# Patient Record
Sex: Male | Born: 1955
Health system: Southern US, Community
[De-identification: ages and names within clinical notes are randomized; demographics above are authoritative.]

## PROBLEM LIST (undated history)

## (undated) DIAGNOSIS — J189 Pneumonia, unspecified organism: Secondary | ICD-10-CM

## (undated) DIAGNOSIS — E119 Type 2 diabetes mellitus without complications: Secondary | ICD-10-CM

## (undated) DIAGNOSIS — K219 Gastro-esophageal reflux disease without esophagitis: Secondary | ICD-10-CM

## (undated) DIAGNOSIS — M1990 Unspecified osteoarthritis, unspecified site: Secondary | ICD-10-CM

## (undated) DIAGNOSIS — I2510 Atherosclerotic heart disease of native coronary artery without angina pectoris: Secondary | ICD-10-CM

## (undated) DIAGNOSIS — I10 Essential (primary) hypertension: Secondary | ICD-10-CM

## (undated) DIAGNOSIS — G4730 Sleep apnea, unspecified: Secondary | ICD-10-CM

## (undated) DIAGNOSIS — E785 Hyperlipidemia, unspecified: Secondary | ICD-10-CM

## (undated) HISTORY — PX: CARDIAC CATHETERIZATION: SHX172

## (undated) HISTORY — PX: TONSILLECTOMY: SUR1361

---

## 2016-03-26 HISTORY — PX: KNEE ARTHROPLASTY: SHX992

## 2018-03-03 ENCOUNTER — Emergency Department (HOSPITAL_COMMUNITY)
Admission: EM | Admit: 2018-03-03 | Discharge: 2018-03-03 | Disposition: A | Discharge status: Home / Self Care | Payer: PRIVATE HEALTH INSURANCE | Attending: Emergency Medicine | Admitting: Emergency Medicine

## 2018-03-03 ENCOUNTER — Encounter (HOSPITAL_COMMUNITY): Payer: Self-pay | Admitting: Emergency Medicine

## 2018-03-03 ENCOUNTER — Emergency Department (HOSPITAL_COMMUNITY): Payer: PRIVATE HEALTH INSURANCE

## 2018-03-03 DIAGNOSIS — R079 Chest pain, unspecified: Secondary | ICD-10-CM

## 2018-03-03 DIAGNOSIS — Z79899 Other long term (current) drug therapy: Secondary | ICD-10-CM | POA: Diagnosis not present

## 2018-03-03 DIAGNOSIS — Z7982 Long term (current) use of aspirin: Secondary | ICD-10-CM | POA: Diagnosis not present

## 2018-03-03 DIAGNOSIS — R0789 Other chest pain: Secondary | ICD-10-CM | POA: Diagnosis present

## 2018-03-03 DIAGNOSIS — I10 Essential (primary) hypertension: Secondary | ICD-10-CM | POA: Insufficient documentation

## 2018-03-03 DIAGNOSIS — Z794 Long term (current) use of insulin: Secondary | ICD-10-CM | POA: Insufficient documentation

## 2018-03-03 DIAGNOSIS — E119 Type 2 diabetes mellitus without complications: Secondary | ICD-10-CM | POA: Insufficient documentation

## 2018-03-03 HISTORY — DX: Type 2 diabetes mellitus without complications: E11.9

## 2018-03-03 HISTORY — DX: Essential (primary) hypertension: I10

## 2018-03-03 LAB — CBC WITH DIFFERENTIAL/PLATELET
Abs Immature Granulocytes: 0.08 10*3/uL — ABNORMAL HIGH (ref 0.00–0.07)
Basophils Absolute: 0.1 10*3/uL (ref 0.0–0.1)
Basophils Relative: 0 %
Eosinophils Absolute: 0.2 10*3/uL (ref 0.0–0.5)
Eosinophils Relative: 2 %
HCT: 50.9 % (ref 39.0–52.0)
Hemoglobin: 15.9 g/dL (ref 13.0–17.0)
IMMATURE GRANULOCYTES: 1 %
Lymphocytes Relative: 31 %
Lymphs Abs: 4 10*3/uL (ref 0.7–4.0)
MCH: 28.8 pg (ref 26.0–34.0)
MCHC: 31.2 g/dL (ref 30.0–36.0)
MCV: 92.2 fL (ref 80.0–100.0)
Monocytes Absolute: 1.1 10*3/uL — ABNORMAL HIGH (ref 0.1–1.0)
Monocytes Relative: 8 %
NEUTROS ABS: 7.7 10*3/uL (ref 1.7–7.7)
NEUTROS PCT: 58 %
Platelets: 280 10*3/uL (ref 150–400)
RBC: 5.52 MIL/uL (ref 4.22–5.81)
RDW: 15.2 % (ref 11.5–15.5)
WBC: 13.1 10*3/uL — ABNORMAL HIGH (ref 4.0–10.5)
nRBC: 0 % (ref 0.0–0.2)

## 2018-03-03 LAB — BASIC METABOLIC PANEL
ANION GAP: 13 (ref 5–15)
BUN: 17 mg/dL (ref 8–23)
CO2: 24 mmol/L (ref 22–32)
Calcium: 9.5 mg/dL (ref 8.9–10.3)
Chloride: 101 mmol/L (ref 98–111)
Creatinine, Ser: 1.26 mg/dL — ABNORMAL HIGH (ref 0.61–1.24)
GFR calc Af Amer: >60 mL/min (ref >60)
GFR calc non Af Amer: >60 mL/min (ref >60)
Glucose, Bld: 222 mg/dL — ABNORMAL HIGH (ref 70–99)
Potassium: 4.6 mmol/L (ref 3.5–5.1)
Sodium: 138 mmol/L (ref 135–145)

## 2018-03-03 LAB — I-STAT TROPONIN, ED: Troponin i, poc: 0 ng/mL (ref 0.00–0.08)

## 2018-03-03 NOTE — ED Provider Notes (Signed)
[facility] EMERGENCY DEPARTMENT Provider Note   CSN: [id number] Arrival date & time: 03/03/18  1741     History   Chief Complaint Chief Complaint  Patient presents with  . Chest Pain    HPI  Cory Sosa is a 62 y.o. male with past medical history of type 2 diabetes, hypertension, presenting to the emergency department with complaint of intermittent chest discomfort for a couple of weeks.  He states his symptoms come and go, when he has the chest discomfort it feels more of an annoyance.  Sometimes it becomes more intense, described as a tightness or pressure.  The pain is nonexertional, lasts 5 to 15 minutes in duration.  Pain is not associated with shortness of breath, diaphoresis, anxiety.  Pain does not radiate.  Denies cardiac history.  States he had a normal stress test 4 to 5 years ago.  Went to the urgent care today who recommended he report to the ED.  He states the urgent care gave him nitro, however when they gave it to him his pain had already subsided. He does state that his diabetes has been somewhat out of control recently after getting steroid injections for his back, though states it is normally well controlled.  The history is provided by the patient.    Past Medical History:  Diagnosis Date  . Diabetes mellitus without complication (HCC)   . Hypertension    controlled with medications    There are no active problems to display for this patient.   History reviewed. No pertinent surgical history.      Home Medications    Prior to Admission medications   Medication Sig Start Date End Date Taking? Authorizing Provider  aspirin EC 81 MG tablet Take 81 mg by mouth daily.   Yes [provider]  Cyanocobalamin (VITAMIN B-12 PO) Take 2 tablets by mouth at bedtime.   Yes [provider]  ibuprofen (ADVIL,MOTRIN) 200 MG tablet Take 800 mg by mouth every 6 (six) hours as needed (pain).   Yes [provider]  Insulin  Isophane & Regular Human (NOVOLIN 70/30 FLEXPEN) (70-30) 100 UNIT/ML PEN Inject 55 Units into the skin 2 (two) times daily.   Yes [provider]  losartan (COZAAR) 25 MG tablet Take 25 mg by mouth daily.   Yes [provider]  metFORMIN (GLUCOPHAGE-XR) 500 MG 24 hr tablet Take 1,000 mg by mouth 2 (two) times daily with a meal. 01/27/18  Yes [provider]  Naphazoline-Pheniramine (ALLERGY EYE OP) Place 1 drop into both eyes daily as needed (itching/allergies).   Yes [provider]  pantoprazole (PROTONIX) 40 MG tablet Take 40 mg by mouth daily.   Yes [provider]  PRESCRIPTION MEDICATION See admin instructions. Monthly steroid injection administered by Dr. Laverta Baltimore, W/S   Yes [provider]  PRESCRIPTION MEDICATION See admin instructions. Monthly allergy shot administered by [facility],    Yes [provider]  rosuvastatin (CRESTOR) 20 MG tablet Take 20 mg by mouth at bedtime.   Yes [provider]  testosterone cypionate (DEPOTESTOTERONE CYPIONATE) 100 MG/ML injection Inject 100 mg into the muscle every 14 (fourteen) days. 01/09/18  Yes [provider]  Vitamin D, Ergocalciferol, (DRISDOL) 1.25 MG (50000 UT) CAPS capsule Take 50,000 Units by mouth every Monday.   Yes [provider]  zolpidem (AMBIEN) 10 MG tablet Take 5 mg by mouth at bedtime as needed for sleep.  01/08/18  Yes [provider]  Family History No family history on file.  Social History Social History   Tobacco Use  . Smoking status: Not on file  Substance Use Topics  . Alcohol use: Not on file  . Drug use: Not on file     Allergies   Contrast media [iodinated diagnostic agents] and Rocephin [ceftriaxone sodium in dextrose]   Review of Systems Review of Systems  Constitutional: Negative for diaphoresis.  Respiratory: Negative for shortness of breath.   Cardiovascular: Positive for  chest pain. Negative for palpitations.  Gastrointestinal: Negative for nausea.  All other systems reviewed and are negative.    Physical Exam Updated Vital Signs BP 123/80   Pulse 77   Temp 98.1 F (36.7 C) (Oral)   Resp (!) 21   Ht 5\' 10"  (1.778 m)   Wt 106.1 kg   SpO2 96%   BMI 33.58 kg/m   Physical Exam  Constitutional: He appears well-developed and well-nourished. No distress.  HENT:  Head: Normocephalic and atraumatic.  Eyes: Conjunctivae are normal.  Neck: Normal range of motion. Neck supple. No JVD present. No tracheal deviation present.  Cardiovascular: Normal rate, regular rhythm, normal heart sounds and intact distal pulses.  Pulmonary/Chest: Effort normal and breath sounds normal. No respiratory distress. He exhibits no tenderness.  Abdominal: Soft. Bowel sounds are normal. He exhibits no distension and no mass. There is no tenderness. There is no guarding.  Musculoskeletal: He exhibits no edema.  Neurological: He is alert.  Skin: Skin is warm. He is not diaphoretic.  Psychiatric: He has a normal mood and affect. His behavior is normal.  Nursing note and vitals reviewed.    ED Treatments / Results  Labs (all labs ordered are listed, but only abnormal results are displayed) Labs Reviewed  BASIC METABOLIC PANEL - Abnormal; Notable for the following components:      Result Value   Glucose, Bld 222 (*)    Creatinine, Ser 1.26 (*)    All other components within normal limits  CBC WITH DIFFERENTIAL/PLATELET - Abnormal; Notable for the following components:   WBC 13.1 (*)    Monocytes Absolute 1.1 (*)    Abs Immature Granulocytes 0.08 (*)    All other components within normal limits  I-STAT TROPONIN, ED    EKG None  Radiology Dg Chest 2 View  Result Date: 03/03/2018 CLINICAL DATA:  Chest pain for 2 weeks EXAM: CHEST - 2 VIEW COMPARISON:  None. FINDINGS: Low volume chest with mild streaky opacity at the bases. No air bronchogram, effusion, or  pneumothorax. Normal heart size and mediastinal contours. IMPRESSION: Low volume chest with mild atelectasis. Electronically Signed   By: Marnee SpringJonathon  Watts M.D.   On: 03/03/2018 19:35    Procedures Procedures (including critical care time)  Medications Ordered in ED Medications - No data to display   Initial Impression / Assessment and Plan / ED Course  I have reviewed the triage vital signs and the nursing notes.  Pertinent labs & imaging results that were available during my care of the patient were reviewed by me and considered in my medical decision making (see chart for details).     Pt presenting with intermittent nonexertional chest pains x2 weeks.  Chest pain is not likely of acute cardiac or pulmonary etiology d/t presentation, low risk Wells, VSS, no tracheal deviation, no JVD or new murmur, RRR, breath sounds equal bilaterally, EKG without acute abnormalities, negative troponin, and negative CXR.  Heart score 4.  Patient is to be discharged with recommendation  to follow up with PCP in regards to today's hospital visit. Pt has been advised to return to the ED if CP becomes exertional, associated with diaphoresis or nausea, radiates to left jaw/arm, worsens or becomes concerning in any way. Pt appears reliable for follow up and is agreeable to discharge.   Discussed results, findings, treatment and follow up. Patient advised of return precautions. Patient verbalized understanding and agreed with plan. Final Clinical Impressions(s) / ED Diagnoses   Final diagnoses:  Intermittent chest pain    ED Discharge Orders    None       Tacari Repass, [place] N, PA-C 03/03/18 2053    Sabas Sous, MD 03/04/18 [id number]

## 2018-03-03 NOTE — Discharge Instructions (Signed)
Please read instructions below. Return to the ER for new or worsening symptoms; including worsening chest pain, shortness of breath, pain that radiates to the arm or neck, pain or shortness of breath worsened with exertion. Follow up with your primary care provider regarding your visit today. ° °

## 2018-03-03 NOTE — ED Triage Notes (Signed)
Pt arrives via EMS from an Urgent Care with reports of intermittent central CP for 2 weeks. Pt took 2 nitro with some relief. Denies any radiation. Hx of diabetes.

## 2018-03-03 NOTE — ED Notes (Signed)
Discharge instructions reviewed with patient. All questions answered. Patient ambulated to vehicle with belongings 

## 2018-03-03 NOTE — ED Notes (Signed)
Pt to xray via stretcher

## 2019-03-27 DIAGNOSIS — I219 Acute myocardial infarction, unspecified: Secondary | ICD-10-CM

## 2019-03-27 HISTORY — DX: Acute myocardial infarction, unspecified: I21.9

## 2020-02-07 ENCOUNTER — Inpatient Hospital Stay (HOSPITAL_COMMUNITY)
Admission: EM | Disposition: A | Discharge status: Home / Self Care | Payer: Self-pay | Source: Home / Self Care | Attending: Cardiology

## 2020-02-07 ENCOUNTER — Inpatient Hospital Stay (HOSPITAL_COMMUNITY)
Admission: EM | Admit: 2020-02-07 | Discharge: 2020-02-08 | DRG: 229 | Disposition: A | Discharge status: Home / Self Care | Payer: Medicare HMO | Attending: Cardiology | Admitting: Cardiology

## 2020-02-07 ENCOUNTER — Emergency Department (HOSPITAL_COMMUNITY): Payer: Medicare HMO

## 2020-02-07 ENCOUNTER — Encounter (HOSPITAL_COMMUNITY): Payer: Self-pay | Admitting: Cardiology

## 2020-02-07 ENCOUNTER — Other Ambulatory Visit: Payer: Self-pay

## 2020-02-07 DIAGNOSIS — Z7982 Long term (current) use of aspirin: Secondary | ICD-10-CM

## 2020-02-07 DIAGNOSIS — Z79899 Other long term (current) drug therapy: Secondary | ICD-10-CM

## 2020-02-07 DIAGNOSIS — I213 ST elevation (STEMI) myocardial infarction of unspecified site: Principal | ICD-10-CM | POA: Diagnosis present

## 2020-02-07 DIAGNOSIS — Z91041 Radiographic dye allergy status: Secondary | ICD-10-CM | POA: Diagnosis not present

## 2020-02-07 DIAGNOSIS — I10 Essential (primary) hypertension: Secondary | ICD-10-CM | POA: Diagnosis present

## 2020-02-07 DIAGNOSIS — Z6834 Body mass index (BMI) 34.0-34.9, adult: Secondary | ICD-10-CM

## 2020-02-07 DIAGNOSIS — I2109 ST elevation (STEMI) myocardial infarction involving other coronary artery of anterior wall: Secondary | ICD-10-CM | POA: Diagnosis present

## 2020-02-07 DIAGNOSIS — E782 Mixed hyperlipidemia: Secondary | ICD-10-CM | POA: Diagnosis present

## 2020-02-07 DIAGNOSIS — Z20822 Contact with and (suspected) exposure to covid-19: Secondary | ICD-10-CM | POA: Diagnosis present

## 2020-02-07 DIAGNOSIS — E1165 Type 2 diabetes mellitus with hyperglycemia: Secondary | ICD-10-CM | POA: Diagnosis present

## 2020-02-07 DIAGNOSIS — Z955 Presence of coronary angioplasty implant and graft: Secondary | ICD-10-CM

## 2020-02-07 DIAGNOSIS — Z888 Allergy status to other drugs, medicaments and biological substances status: Secondary | ICD-10-CM

## 2020-02-07 DIAGNOSIS — Z794 Long term (current) use of insulin: Secondary | ICD-10-CM | POA: Diagnosis not present

## 2020-02-07 DIAGNOSIS — E669 Obesity, unspecified: Secondary | ICD-10-CM | POA: Diagnosis present

## 2020-02-07 HISTORY — PX: CORONARY/GRAFT ACUTE MI REVASCULARIZATION: CATH118305

## 2020-02-07 HISTORY — PX: CORONARY THROMBECTOMY: CATH118304

## 2020-02-07 HISTORY — PX: LEFT HEART CATH AND CORONARY ANGIOGRAPHY: CATH118249

## 2020-02-07 LAB — CBC WITH DIFFERENTIAL/PLATELET
Abs Immature Granulocytes: 0.02 10*3/uL (ref 0.00–0.07)
Basophils Absolute: 0.1 10*3/uL (ref 0.0–0.1)
Basophils Relative: 1 %
Eosinophils Absolute: 0.3 10*3/uL (ref 0.0–0.5)
Eosinophils Relative: 3 %
HCT: 45 % (ref 39.0–52.0)
Hemoglobin: 15 g/dL (ref 13.0–17.0)
Immature Granulocytes: 0 %
Lymphocytes Relative: 52 %
Lymphs Abs: 5.4 10*3/uL — ABNORMAL HIGH (ref 0.7–4.0)
MCH: 31 pg (ref 26.0–34.0)
MCHC: 33.3 g/dL (ref 30.0–36.0)
MCV: 93 fL (ref 80.0–100.0)
Monocytes Absolute: 0.9 10*3/uL (ref 0.1–1.0)
Monocytes Relative: 9 %
Neutro Abs: 3.6 10*3/uL (ref 1.7–7.7)
Neutrophils Relative %: 35 %
Platelets: 302 10*3/uL (ref 150–400)
RBC: 4.84 MIL/uL (ref 4.22–5.81)
RDW: 13.8 % (ref 11.5–15.5)
WBC: 10.3 10*3/uL (ref 4.0–10.5)
nRBC: 0 % (ref 0.0–0.2)

## 2020-02-07 LAB — COMPREHENSIVE METABOLIC PANEL
ALT: 72 U/L — ABNORMAL HIGH (ref 0–44)
AST: 45 U/L — ABNORMAL HIGH (ref 15–41)
Albumin: 3.9 g/dL (ref 3.5–5.0)
Alkaline Phosphatase: 57 U/L (ref 38–126)
Anion gap: 11 (ref 5–15)
BUN: 14 mg/dL (ref 8–23)
CO2: 24 mmol/L (ref 22–32)
Calcium: 9.5 mg/dL (ref 8.9–10.3)
Chloride: 105 mmol/L (ref 98–111)
Creatinine, Ser: 0.97 mg/dL (ref 0.61–1.24)
GFR, Estimated: >60 mL/min (ref >60)
Glucose, Bld: 178 mg/dL — ABNORMAL HIGH (ref 70–99)
Potassium: 3.5 mmol/L (ref 3.5–5.1)
Sodium: 140 mmol/L (ref 135–145)
Total Bilirubin: 0.5 mg/dL (ref 0.3–1.2)
Total Protein: 6.8 g/dL (ref 6.5–8.1)

## 2020-02-07 LAB — RESPIRATORY PANEL BY RT PCR (FLU A&B, COVID)
Influenza A by PCR: NEGATIVE
Influenza B by PCR: NEGATIVE
SARS Coronavirus 2 by RT PCR: NEGATIVE

## 2020-02-07 LAB — LIPID PANEL
Cholesterol: 116 mg/dL (ref 0–200)
HDL: 42 mg/dL (ref >40)
LDL Cholesterol: 33 mg/dL (ref 0–99)
Total CHOL/HDL Ratio: 2.8 RATIO
Triglycerides: 207 mg/dL — ABNORMAL HIGH (ref <150)
VLDL: 41 mg/dL — ABNORMAL HIGH (ref 0–40)

## 2020-02-07 LAB — HEMOGLOBIN A1C
Hgb A1c MFr Bld: 8.1 % — ABNORMAL HIGH (ref 4.8–5.6)
Mean Plasma Glucose: 185.77 mg/dL

## 2020-02-07 LAB — APTT: aPTT: 27 seconds (ref 24–36)

## 2020-02-07 LAB — PROTIME-INR
INR: 0.9 (ref 0.8–1.2)
Prothrombin Time: 11.8 seconds (ref 11.4–15.2)

## 2020-02-07 LAB — MRSA PCR SCREENING: MRSA by PCR: NEGATIVE

## 2020-02-07 LAB — TROPONIN I (HIGH SENSITIVITY): Troponin I (High Sensitivity): 24 ng/L — ABNORMAL HIGH (ref <18)

## 2020-02-07 SURGERY — CORONARY/GRAFT ACUTE MI REVASCULARIZATION
Anesthesia: LOCAL

## 2020-02-07 MED ORDER — OXYCODONE HCL 5 MG PO TABS
5.0000 mg | ORAL_TABLET | ORAL | Status: DC | PRN
  Administered 2020-02-07: 10 mg via ORAL
  Filled 2020-02-07: qty 2

## 2020-02-07 MED ORDER — LOSARTAN POTASSIUM 25 MG PO TABS: 25.0000 mg | ORAL_TABLET | Freq: Every day | ORAL | Status: DC

## 2020-02-07 MED ORDER — ROSUVASTATIN CALCIUM 20 MG PO TABS
20.0000 mg | ORAL_TABLET | Freq: Every day | ORAL | Status: DC
  Administered 2020-02-07: 20 mg via ORAL
  Filled 2020-02-07: qty 1

## 2020-02-07 MED ORDER — METHYLPREDNISOLONE SODIUM SUCC 125 MG IJ SOLR
INTRAMUSCULAR | Status: AC
  Filled 2020-02-07: qty 2

## 2020-02-07 MED ORDER — TICAGRELOR 90 MG PO TABS
90.0000 mg | ORAL_TABLET | Freq: Two times a day (BID) | ORAL | Status: DC
  Administered 2020-02-08: 90 mg via ORAL
  Filled 2020-02-07: qty 1

## 2020-02-07 MED ORDER — NITROGLYCERIN 1 MG/10 ML FOR IR/CATH LAB
INTRA_ARTERIAL | Status: AC
  Filled 2020-02-07: qty 10

## 2020-02-07 MED ORDER — TIROFIBAN (AGGRASTAT) BOLUS VIA INFUSION
INTRAVENOUS | Status: DC | PRN
  Administered 2020-02-07: 2687.5 ug via INTRAVENOUS

## 2020-02-07 MED ORDER — HEPARIN SODIUM (PORCINE) 5000 UNIT/ML IJ SOLN: 60.0000 [IU]/kg | Freq: Once | INTRAMUSCULAR | Status: DC

## 2020-02-07 MED ORDER — ONDANSETRON HCL 4 MG/2ML IJ SOLN
INTRAMUSCULAR | Status: AC
  Filled 2020-02-07: qty 2

## 2020-02-07 MED ORDER — PANTOPRAZOLE SODIUM 40 MG PO TBEC
40.0000 mg | DELAYED_RELEASE_TABLET | Freq: Every day | ORAL | Status: DC
  Administered 2020-02-08: 40 mg via ORAL
  Filled 2020-02-07: qty 1

## 2020-02-07 MED ORDER — LIDOCAINE HCL (PF) 1 % IJ SOLN
INTRAMUSCULAR | Status: DC | PRN
  Administered 2020-02-07: 2 mL

## 2020-02-07 MED ORDER — INSULIN ASPART PROT & ASPART (70-30 MIX) 100 UNIT/ML ~~LOC~~ SUSP
55.0000 [IU] | Freq: Two times a day (BID) | SUBCUTANEOUS | Status: DC
  Administered 2020-02-08: 55 [IU] via SUBCUTANEOUS
  Filled 2020-02-07: qty 10

## 2020-02-07 MED ORDER — TIROFIBAN HCL IN NACL 5-0.9 MG/100ML-% IV SOLN
INTRAVENOUS | Status: AC | PRN
  Administered 2020-02-07: 0.15 ug/kg/min via INTRAVENOUS

## 2020-02-07 MED ORDER — FENTANYL CITRATE (PF) 100 MCG/2ML IJ SOLN
INTRAMUSCULAR | Status: DC | PRN
  Administered 2020-02-07: 25 ug via INTRAVENOUS
  Administered 2020-02-07: 50 ug via INTRAVENOUS
  Administered 2020-02-07: 25 ug via INTRAVENOUS

## 2020-02-07 MED ORDER — MIDAZOLAM HCL 2 MG/2ML IJ SOLN
INTRAMUSCULAR | Status: DC | PRN
  Administered 2020-02-07: 2 mg via INTRAVENOUS

## 2020-02-07 MED ORDER — HEPARIN SODIUM (PORCINE) 5000 UNIT/ML IJ SOLN
4000.0000 [IU] | Freq: Once | INTRAMUSCULAR | Status: AC
  Administered 2020-02-07: 4000 [IU] via INTRAVENOUS

## 2020-02-07 MED ORDER — FAMOTIDINE IN NACL 20-0.9 MG/50ML-% IV SOLN
INTRAVENOUS | Status: AC | PRN
  Administered 2020-02-07: 20 mg via INTRAVENOUS

## 2020-02-07 MED ORDER — HEPARIN SODIUM (PORCINE) 1000 UNIT/ML IJ SOLN
INTRAMUSCULAR | Status: DC | PRN
  Administered 2020-02-07: 8000 [IU] via INTRAVENOUS
  Administered 2020-02-07: 2000 [IU] via INTRAVENOUS

## 2020-02-07 MED ORDER — IOHEXOL 350 MG/ML SOLN
INTRAVENOUS | Status: DC | PRN
  Administered 2020-02-07: 100 mL

## 2020-02-07 MED ORDER — HEPARIN SODIUM (PORCINE) 1000 UNIT/ML IJ SOLN
INTRAMUSCULAR | Status: AC
  Filled 2020-02-07: qty 1

## 2020-02-07 MED ORDER — SODIUM CHLORIDE 0.9 % IV SOLN: 250.0000 mL | INTRAVENOUS | Status: DC | PRN

## 2020-02-07 MED ORDER — MORPHINE SULFATE (PF) 2 MG/ML IV SOLN
INTRAVENOUS | Status: AC
  Administered 2020-02-07: 2 mg via INTRAVENOUS
  Filled 2020-02-07: qty 1

## 2020-02-07 MED ORDER — METHYLPREDNISOLONE SODIUM SUCC 125 MG IJ SOLR
INTRAMUSCULAR | Status: DC | PRN
  Administered 2020-02-07: 125 mg via INTRAVENOUS

## 2020-02-07 MED ORDER — VERAPAMIL HCL 2.5 MG/ML IV SOLN
INTRAVENOUS | Status: DC | PRN
  Administered 2020-02-07: 10 mL via INTRA_ARTERIAL

## 2020-02-07 MED ORDER — HYDRALAZINE HCL 20 MG/ML IJ SOLN: 5.0000 mg | INTRAMUSCULAR | Status: AC | PRN

## 2020-02-07 MED ORDER — FAMOTIDINE IN NACL 20-0.9 MG/50ML-% IV SOLN
INTRAVENOUS | Status: AC
  Filled 2020-02-07: qty 50

## 2020-02-07 MED ORDER — MIDAZOLAM HCL 2 MG/2ML IJ SOLN
INTRAMUSCULAR | Status: AC
  Filled 2020-02-07: qty 2

## 2020-02-07 MED ORDER — SODIUM CHLORIDE 0.9% FLUSH: 3.0000 mL | INTRAVENOUS | Status: DC | PRN

## 2020-02-07 MED ORDER — METOPROLOL TARTRATE 25 MG PO TABS
25.0000 mg | ORAL_TABLET | Freq: Two times a day (BID) | ORAL | Status: DC
  Administered 2020-02-07: 25 mg via ORAL
  Filled 2020-02-07: qty 1

## 2020-02-07 MED ORDER — IOHEXOL 350 MG/ML SOLN
INTRAVENOUS | Status: AC
  Filled 2020-02-07: qty 1

## 2020-02-07 MED ORDER — FENTANYL CITRATE (PF) 100 MCG/2ML IJ SOLN
INTRAMUSCULAR | Status: AC
  Filled 2020-02-07: qty 2

## 2020-02-07 MED ORDER — HEPARIN SODIUM (PORCINE) 5000 UNIT/ML IJ SOLN
INTRAMUSCULAR | Status: AC
  Filled 2020-02-07: qty 1

## 2020-02-07 MED ORDER — ASPIRIN 81 MG PO CHEW
81.0000 mg | CHEWABLE_TABLET | Freq: Every day | ORAL | Status: DC
  Administered 2020-02-08: 81 mg via ORAL
  Filled 2020-02-07: qty 1

## 2020-02-07 MED ORDER — ONDANSETRON HCL 4 MG/2ML IJ SOLN
4.0000 mg | Freq: Four times a day (QID) | INTRAMUSCULAR | Status: DC | PRN
  Administered 2020-02-07: 4 mg via INTRAVENOUS

## 2020-02-07 MED ORDER — NITROGLYCERIN 0.4 MG SL SUBL: 0.4000 mg | SUBLINGUAL_TABLET | SUBLINGUAL | Status: DC | PRN

## 2020-02-07 MED ORDER — SODIUM CHLORIDE 0.9 % WEIGHT BASED INFUSION
1.0000 mL/kg/h | INTRAVENOUS | Status: AC
  Administered 2020-02-07 – 2020-02-08 (×2): 1 mL/kg/h via INTRAVENOUS

## 2020-02-07 MED ORDER — DIPHENHYDRAMINE HCL 50 MG/ML IJ SOLN
INTRAMUSCULAR | Status: DC | PRN
  Administered 2020-02-07: 50 mg via INTRAVENOUS

## 2020-02-07 MED ORDER — MORPHINE SULFATE (PF) 4 MG/ML IV SOLN: 4.0000 mg | INTRAVENOUS | Status: DC | PRN

## 2020-02-07 MED ORDER — SODIUM CHLORIDE 0.9 % IV SOLN: INTRAVENOUS | Status: DC

## 2020-02-07 MED ORDER — TIROFIBAN HCL IN NACL 5-0.9 MG/100ML-% IV SOLN
INTRAVENOUS | Status: AC
  Filled 2020-02-07: qty 100

## 2020-02-07 MED ORDER — SODIUM CHLORIDE 0.9% FLUSH: 3.0000 mL | Freq: Two times a day (BID) | INTRAVENOUS | Status: DC

## 2020-02-07 MED ORDER — CHLORHEXIDINE GLUCONATE CLOTH 2 % EX PADS
6.0000 | MEDICATED_PAD | Freq: Every day | CUTANEOUS | Status: DC
  Administered 2020-02-07: 6 via TOPICAL

## 2020-02-07 MED ORDER — ZOLPIDEM TARTRATE 5 MG PO TABS: 5.0000 mg | ORAL_TABLET | Freq: Every evening | ORAL | Status: DC | PRN

## 2020-02-07 MED ORDER — DIPHENHYDRAMINE HCL 50 MG/ML IJ SOLN
INTRAMUSCULAR | Status: AC
  Filled 2020-02-07: qty 1

## 2020-02-07 MED ORDER — TICAGRELOR 90 MG PO TABS
ORAL_TABLET | ORAL | Status: DC | PRN
  Administered 2020-02-07: 180 mg via ORAL

## 2020-02-07 MED ORDER — ACETAMINOPHEN 325 MG PO TABS: 650.0000 mg | ORAL_TABLET | ORAL | Status: DC | PRN

## 2020-02-07 SURGICAL SUPPLY — 18 items
BALLN SAPPHIRE 2.5X12 (BALLOONS) ×2
BALLOON SAPPHIRE 2.5X12 (BALLOONS) ×1 IMPLANT
CATH EXTRAC PRONTO 5.5F 138CM (CATHETERS) ×2 IMPLANT
CATH OPTITORQUE TIG 4.0 5F (CATHETERS) ×2 IMPLANT
CATH VISTA GUIDE 6FR XBLAD3.5 (CATHETERS) ×2 IMPLANT
DEVICE RAD COMP TR BAND LRG (VASCULAR PRODUCTS) ×2 IMPLANT
GLIDESHEATH SLEND A-KIT 6F 22G (SHEATH) ×2 IMPLANT
GUIDEWIRE INQWIRE 1.5J.035X260 (WIRE) ×1 IMPLANT
INQWIRE 1.5J .035X260CM (WIRE) ×2
KIT ENCORE 26 ADVANTAGE (KITS) ×2 IMPLANT
KIT HEART LEFT (KITS) ×2 IMPLANT
PACK CARDIAC CATHETERIZATION (CUSTOM PROCEDURE TRAY) ×2 IMPLANT
STENT RESOLUTE ONYX 4.0X38 (Permanent Stent) ×2 IMPLANT
STENT SYNERGY XD 3.50X12 (Permanent Stent) ×1 IMPLANT
SYNERGY XD 3.50X12 (Permanent Stent) ×2 IMPLANT
TRANSDUCER W/STOPCOCK (MISCELLANEOUS) ×2 IMPLANT
TUBING CIL FLEX 10 FLL-RA (TUBING) ×2 IMPLANT
WIRE COUGAR XT STRL 190CM (WIRE) ×2 IMPLANT

## 2020-02-07 NOTE — ED Triage Notes (Signed)
Pt bib gcems w/ c/o 10/10 centrally located non-radiating 10/10 chest pain described as sharp at 1915. Pt also endorses dizziness and nausea. CODE STEMI activated by EMS. Pt took 324 mg aspirin prior to EMS arrival and had SL nitro x 3 w/ EMS. BP 126/84 w/ EMS.

## 2020-02-07 NOTE — ED Provider Notes (Signed)
[facility] EMERGENCY DEPARTMENT Provider [facility]   CSN: [id number] Arrival date & time: 02/07/20  2009     History Chief Complaint  Patient presents with  . Code STEMI    Cory FantasiaJohn Sosa is a 64 y.o. male.  The history is provided by the patient and the EMS personnel.  Chest Pain Pain location:  Substernal area Pain radiates to:  Does not radiate Onset quality:  Sudden Duration:  1 hour Timing:  Constant Progression:  Waxing and waning Chronicity:  New Context: at rest   Relieved by:  Nitroglycerin Associated symptoms: diaphoresis and nausea   Associated symptoms: no abdominal pain, no altered mental status, no back pain, no cough, no dizziness, no fever, no headache, no near-syncope, no numbness, no shortness of breath, no vomiting and no weakness   Risk factors: diabetes mellitus and hypertension   Risk factors: no prior DVT/PE        Past Medical History:  Diagnosis Date  . Diabetes mellitus without complication (HCC)   . Hypertension    controlled with medications    Patient Active Problem List   Diagnosis Date Noted  . Acute ST elevation myocardial infarction (STEMI) of anterolateral wall (HCC) 02/07/2020    No past surgical history on file.     No family history on file.  Social History   Tobacco Use  . Smoking status: Not on file  Substance Use Topics  . Alcohol use: Not on file  . Drug use: Not on file    Home Medications Prior to Admission medications   Medication Sig Start Date End Date Taking? Authorizing Provider  aspirin EC 81 MG tablet Take 81 mg by mouth daily.    [provider]  Cyanocobalamin (VITAMIN B-12 PO) Take 2 tablets by mouth at bedtime.    [provider]  ibuprofen (ADVIL,MOTRIN) 200 MG tablet Take 800 mg by mouth every 6 (six) hours as needed (pain).    [provider]  Insulin Isophane & Regular Human (NOVOLIN 70/30 FLEXPEN) (70-30) 100 UNIT/ML PEN Inject 55 Units into the skin 2  (two) times daily.    [provider]  losartan (COZAAR) 25 MG tablet Take 25 mg by mouth daily.    [provider]  metFORMIN (GLUCOPHAGE-XR) 500 MG 24 hr tablet Take 1,000 mg by mouth 2 (two) times daily with a meal. 01/27/18   [provider]  Naphazoline-Pheniramine (ALLERGY EYE OP) Place 1 drop into both eyes daily as needed (itching/allergies).    [provider]  pantoprazole (PROTONIX) 40 MG tablet Take 40 mg by mouth daily.    [provider]  PRESCRIPTION MEDICATION See admin instructions. Monthly steroid injection administered by Dr. Laverta BaltimoreEndreas Renheim, W/S    [provider]  PRESCRIPTION MEDICATION See admin instructions. Monthly allergy shot administered by [facility], [place]    [provider]  rosuvastatin (CRESTOR) 20 MG tablet Take 20 mg by mouth at bedtime.    [provider]  testosterone cypionate (DEPOTESTOTERONE CYPIONATE) 100 MG/ML injection Inject 100 mg into the muscle every 14 (fourteen) days. 01/09/18   [provider]  Vitamin D, Ergocalciferol, (DRISDOL) 1.25 MG (50000 UT) CAPS capsule Take 50,000 Units by mouth every Monday.    [provider]  zolpidem (AMBIEN) 10 MG tablet Take 5 mg by mouth at bedtime as needed for sleep.  01/08/18   [provider]    Allergies    Contrast media [iodinated diagnostic agents] and Rocephin [ceftriaxone sodium  in dextrose]  Review of Systems   Review of Systems  Constitutional: Positive for diaphoresis. Negative for fever.  Respiratory: Negative for cough and shortness of breath.   Cardiovascular: Positive for chest pain. Negative for near-syncope.  Gastrointestinal: Positive for nausea. Negative for abdominal pain, diarrhea and vomiting.  Genitourinary: Negative for difficulty urinating.  Musculoskeletal: Negative for back pain and neck pain.  Skin: Negative for rash.  Neurological: Negative for dizziness, weakness,  numbness and headaches.  All other systems reviewed and are negative.   Physical Exam Updated Vital Signs BP (!) 146/93   Pulse (!) 117   Temp (!) 96.9 F (36.1 C) (Temporal)   Resp (!) 21   Ht 5\' 10"  (1.778 m)   Wt 107.5 kg   SpO2 97%   BMI 34.01 kg/m   Physical Exam Vitals reviewed.  Constitutional:      General: He is in acute distress.  HENT:     Head: Normocephalic and atraumatic.     Nose: Nose normal.  Eyes:     Conjunctiva/sclera: Conjunctivae normal.  Cardiovascular:     Rate and Rhythm: Normal rate.     Pulses: Normal pulses.     Heart sounds: Normal heart sounds.  Pulmonary:     Effort: Pulmonary effort is normal. No respiratory distress.     Breath sounds: Normal breath sounds. No wheezing.  Abdominal:     General: Abdomen is flat.     Palpations: Abdomen is soft.     Tenderness: There is no abdominal tenderness.  Musculoskeletal:     Cervical back: Neck supple. No tenderness.     Right lower leg: No edema.     Left lower leg: No edema.  Skin:    General: Skin is warm and dry.  Neurological:     General: No focal deficit present.     Mental Status: He is alert.  Psychiatric:        Mood and Affect: Mood normal.        Behavior: Behavior normal.     ED Results / Procedures / Treatments   Labs (all labs ordered are listed, but only abnormal results are displayed) Labs Reviewed  HEMOGLOBIN A1C - Abnormal; Notable for the following components:      Result Value   Hgb A1c MFr Bld 8.1 (*)    All other components within normal limits  CBC WITH DIFFERENTIAL/PLATELET - Abnormal; Notable for the following components:   Lymphs Abs 5.4 (*)    All other components within normal limits  RESPIRATORY PANEL BY RT PCR (FLU A&B, COVID)  PROTIME-INR  APTT  COMPREHENSIVE METABOLIC PANEL  LIPID PANEL  TROPONIN I (HIGH SENSITIVITY)    EKG None  Radiology DG Chest Portable 1 View  Result Date: 02/07/2020 CLINICAL DATA:  Chest pain, dizziness, nausea  EXAM: PORTABLE CHEST 1 VIEW COMPARISON:  03/03/2018 FINDINGS: Single frontal view of the chest demonstrates an unremarkable cardiac silhouette. No airspace disease, effusion, or pneumothorax. No acute bony abnormality. IMPRESSION: 1. No acute intrathoracic process. Electronically Signed   By: 14/11/2017 M.D.   On: 02/07/2020 20:43    Procedures Procedures (including critical care time)  Medications Ordered in ED Medications  0.9 %  sodium chloride infusion (has no administration in time range)  methylPREDNISolone sodium succinate (SOLU-MEDROL) 125 mg/2 mL injection (125 mg Intravenous Given 02/07/20 2040)  diphenhydrAMINE (BENADRYL) injection (50 mg Intravenous Given 02/07/20 2040)  lidocaine (PF) (XYLOCAINE) 1 % injection (2 mLs Infiltration Given 02/07/20 2041)  famotidine (PEPCID) IVPB 20 mg premix (20 mg Intravenous New Bag/Given 02/07/20 2043)  midazolam (VERSED) injection (2 mg Intravenous Given 02/07/20 2044)  fentaNYL (SUBLIMAZE) injection (25 mcg Intravenous Given 02/07/20 2116)  heparin sodium (porcine) injection (2,000 Units Intravenous Given 02/07/20 2116)  heparin 5000 UNIT/ML injection (has no administration in time range)  Radial Cocktail/Verapamil only (10 mLs Intra-arterial Given 02/07/20 2046)  tirofiban (AGGRASTAT) infusion 50 mcg/mL 100 mL (0.15 mcg/kg/min  107.5 kg Intravenous New Bag/Given 02/07/20 2059)  tirofiban (AGGRASTAT) bolus via infusion (2,687.5 mcg Intravenous Given 02/07/20 2054)  ticagrelor (BRILINTA) tablet (180 mg Oral Given 02/07/20 2054)  iohexol (OMNIPAQUE) 350 MG/ML injection (100 mLs  Given 02/07/20 2117)  morphine 2 MG/ML injection (2 mg Intravenous Given 02/07/20 2014)  morphine 2 MG/ML injection (2 mg Intravenous Given 02/07/20 2014)  heparin injection 4,000 Units (4,000 Units Intravenous Given 02/07/20 2019)    ED Course  I have reviewed the triage vital signs and the nursing notes.  Pertinent labs & imaging results that were available  during my care of the patient were reviewed by me and considered in my medical decision making (see chart for details).    MDM Rules/Calculators/A&P                           Medical Decision Making: Deaundre Allston is a 64 y.o. male who presented to the ED today with chest pain.   Pt reports sudden CP at 7:15, better with nitro and now getting worse. Concern for STEMI on 12 lead by EMS. Code STEMI called.   Past medical history significant for HTN, DM Pt denies history of ACS  Reviewed and confirmed nursing documentation for past medical history, family history, social history.  On my initial exam, the pt was uncomfortable, non diaphoretic, lungs CTAB, normal heart sounds.  Pt reporting 7/10 pain, worsening, given morphine.   Cardiology at bedisde shortly after pt arrival.  Concern for STEMI evolving given EKG here, plan to go to Cath lab.   Consults: Cardiology   All radiology and laboratory studies reviewed independently and with my attending physician, agree with reading provided by radiologist unless otherwise noted.   Based on the above findings, I believe  patient requires admission.   Dispo:  To CATH LAB   The above care was discussed with and agreed upon by my attending physician. Emergency Department Medication Summary:  Medications  0.9 %  sodium chloride infusion (has no administration in time range)  methylPREDNISolone sodium succinate (SOLU-MEDROL) 125 mg/2 mL injection (125 mg Intravenous Given 02/07/20 2040)  diphenhydrAMINE (BENADRYL) injection (50 mg Intravenous Given 02/07/20 2040)  lidocaine (PF) (XYLOCAINE) 1 % injection (2 mLs Infiltration Given 02/07/20 2041)  famotidine (PEPCID) IVPB 20 mg premix (20 mg Intravenous New Bag/Given 02/07/20 2043)  midazolam (VERSED) injection (2 mg Intravenous Given 02/07/20 2044)  fentaNYL (SUBLIMAZE) injection (25 mcg Intravenous Given 02/07/20 2116)  heparin sodium (porcine) injection (2,000 Units Intravenous Given  02/07/20 2116)  heparin 5000 UNIT/ML injection (has no administration in time range)  Radial Cocktail/Verapamil only (10 mLs Intra-arterial Given 02/07/20 2046)  tirofiban (AGGRASTAT) infusion 50 mcg/mL 100 mL (0.15 mcg/kg/min  107.5 kg Intravenous New Bag/Given 02/07/20 2059)  tirofiban (AGGRASTAT) bolus via infusion (2,687.5 mcg Intravenous Given 02/07/20 2054)  ticagrelor (BRILINTA) tablet (180 mg Oral Given 02/07/20 2054)  iohexol (OMNIPAQUE) 350 MG/ML injection (100 mLs  Given 02/07/20 2117)  morphine 2 MG/ML injection (2 mg Intravenous Given 02/07/20 2014)  morphine  2 MG/ML injection (2 mg Intravenous Given 02/07/20 2014)  heparin injection 4,000 Units (4,000 Units Intravenous Given 02/07/20 2019)       Final Clinical Impression(s) / ED Diagnoses Final diagnoses:  None    Rx / DC Orders ED Discharge Orders    None       Brantley Fling, MD 02/07/20 2128    Cathren Laine, MD 02/07/20 [phone]

## 2020-02-07 NOTE — ED Notes (Addendum)
4 mg IV morphine given

## 2020-02-07 NOTE — H&P (Signed)
CARDIOLOGY ADMIT NOTE   Patient ID: Cory Sosa MRN: [id number] DOB/AGE: Aug 07, 1955 64 y.o.  Admit date: 02/07/2020 Primary Physician:  [name], MD  Patient ID: Cory Sosa, male    DOB: [birth year], 64 y.o.   MRN: [id number]  Chief Complaint  Patient presents with  . Code STEMI   HPI:    Cory Sosa  is a 64 y.o. Caucasian male with history of hypertension, diabetes mellitus, non-smoker, no family history of premature coronary artery disease who presents to the emergency room via EMS, around 7:30 PM this evening, while at home, developed severe crushing midsternal chest discomfort associated with diaphoresis and dyspnea.  Due to persistent symptoms, he activated EMS, received 2 sublingual nitroglycerin and also 2 aspirins with mild relief of chest discomfort but has now recurrence of chest pain and does not feel well.  No radiation of the pain to the back, no radiation to the neck.  No recent leg swelling, no recent travel.  Past Medical History:  Diagnosis Date  . Diabetes mellitus without complication (HCC)   . Hypertension    controlled with medications   No past surgical history on file. Social History   Socioeconomic History  . Marital status: Married    Spouse name: Not on file  . Number of children: Not on file  . Years of education: Not on file  . Highest education level: Not on file  Occupational History  . Not on file  Tobacco Use  . Smoking status: Not on file  Substance and Sexual Activity  . Alcohol use: Not on file  . Drug use: Not on file  . Sexual activity: Not on file  Other Topics Concern  . Not on file  Social History Narrative  . Not on file   Social Determinants of Health   Financial Resource Strain:   . Difficulty of Paying Living Expenses: Not on file  Food Insecurity:   . Worried About Programme researcher, broadcasting/film/video in the Last Year: Not on file  . Ran Out of Food in the Last Year: Not on file  Transportation Needs:   . Lack of  Transportation (Medical): Not on file  . Lack of Transportation (Non-Medical): Not on file  Physical Activity:   . Days of Exercise per Week: Not on file  . Minutes of Exercise per Session: Not on file  Stress:   . Feeling of Stress : Not on file  Social Connections:   . Frequency of Communication with Friends and Family: Not on file  . Frequency of Social Gatherings with Friends and Family: Not on file  . Attends Religious Services: Not on file  . Active Member of Clubs or Organizations: Not on file  . Attends Banker Meetings: Not on file  . Marital Status: Not on file  Intimate Partner Violence:   . Fear of Current or Ex-Partner: Not on file  . Emotionally Abused: Not on file  . Physically Abused: Not on file  . Sexually Abused: Not on file   Family history: There is no history of premature coronary disease or diabetes in the family.  ROS  Review of Systems  Cardiovascular: Positive for chest pain and dyspnea on exertion. Negative for leg swelling.  Gastrointestinal: Negative for melena.  All other systems reviewed and are negative.  Objective   Vitals with BMI 02/07/2020 02/07/2020 03/03/2018  Height - - -  Weight - - -  BMI - - -  Systolic - 128 127  Diastolic -  91 82  Pulse 70 - 73    Physical Exam Vitals reviewed.  Constitutional:      Appearance: Normal appearance.  Eyes:     Extraocular Movements: Extraocular movements intact.  Cardiovascular:     Rate and Rhythm: Normal rate and regular rhythm.     Pulses: Intact distal pulses.     Heart sounds: Normal heart sounds. No murmur heard.  No gallop.      Comments: No leg edema, no JVD. Pulmonary:     Effort: Pulmonary effort is normal.     Breath sounds: Normal breath sounds.  Abdominal:     General: Bowel sounds are normal.     Palpations: Abdomen is soft.  Musculoskeletal:        General: Normal range of motion.     Cervical back: Normal range of motion and neck supple.  Skin:     General: Skin is warm.     Capillary Refill: Capillary refill takes less than 2 seconds.  Neurological:     Mental Status: He is alert.  Psychiatric:        Mood and Affect: Mood normal.        Behavior: Behavior normal.    Laboratory examination:   No results for input(s): NA, K, CL, CO2, GLUCOSE, BUN, CREATININE, CALCIUM, GFRNONAA, GFRAA in the last 8760 hours. CrCl cannot be calculated (Patient's most recent lab result is older than the maximum 21 days allowed.).  CMP Latest Ref Rng & Units 03/03/2018  Glucose 70 - 99 mg/dL 993(Z)  BUN 8 - 23 mg/dL 17  Creatinine 1.69 - 6.78 mg/dL 9.38(B)  Sodium 017 - 510 mmol/L 138  Potassium 3.5 - 5.1 mmol/L 4.6  Chloride 98 - 111 mmol/L 101  CO2 22 - 32 mmol/L 24  Calcium 8.9 - 10.3 mg/dL 9.5   CBC Latest Ref Rng & Units 03/03/2018  WBC 4.0 - 10.5 K/uL 13.1(H)  Hemoglobin 13.0 - 17.0 g/dL 25.8  Hematocrit 39 - 52 % 50.9  Platelets 150 - 400 K/uL 280   Lipid Panel  No results found for: CHOL, TRIG, HDL, CHOLHDL, VLDL, LDLCALC, LDLDIRECT HEMOGLOBIN A1C No results found for: HGBA1C, MPG TSH No results for input(s): TSH in the last 8760 hours. BNP (last 3 results) No results for input(s): BNP in the last 8760 hours.  Medications and allergies   Allergies  Allergen Reactions  . Contrast Media [Iodinated Diagnostic Agents] Nausea And Vomiting  . Rocephin [Ceftriaxone Sodium In Dextrose] Nausea And Vomiting    No current facility-administered medications on file prior to encounter.   Current Outpatient Medications on File Prior to Encounter  Medication Sig Dispense Refill  . aspirin EC 81 MG tablet Take 81 mg by mouth daily.    . Cyanocobalamin (VITAMIN B-12 PO) Take 2 tablets by mouth at bedtime.    Marland Kitchen ibuprofen (ADVIL,MOTRIN) 200 MG tablet Take 800 mg by mouth every 6 (six) hours as needed (pain).    . Insulin Isophane & Regular Human (NOVOLIN 70/30 FLEXPEN) (70-30) 100 UNIT/ML PEN Inject 55 Units into the skin 2 (two) times daily.     Marland Kitchen losartan (COZAAR) 25 MG tablet Take 25 mg by mouth daily.    . metFORMIN (GLUCOPHAGE-XR) 500 MG 24 hr tablet Take 1,000 mg by mouth 2 (two) times daily with a meal.  5  . Naphazoline-Pheniramine (ALLERGY EYE OP) Place 1 drop into both eyes daily as needed (itching/allergies).    . pantoprazole (PROTONIX) 40 MG tablet Take 40 mg  by mouth daily.    Marland Kitchen PRESCRIPTION MEDICATION See admin instructions. Monthly steroid injection administered by Dr. Laverta Baltimore, W/S    . PRESCRIPTION MEDICATION See admin instructions. Monthly allergy shot administered by [facility], Kathryne Sharper    . rosuvastatin (CRESTOR) 20 MG tablet Take 20 mg by mouth at bedtime.    Marland Kitchen testosterone cypionate (DEPOTESTOTERONE CYPIONATE) 100 MG/ML injection Inject 100 mg into the muscle every 14 (fourteen) days.  2  . Vitamin D, Ergocalciferol, (DRISDOL) 1.25 MG (50000 UT) CAPS capsule Take 50,000 Units by mouth every Monday.    . zolpidem (AMBIEN) 10 MG tablet Take 5 mg by mouth at bedtime as needed for sleep.   2     No intake/output data recorded. No intake/output data recorded.    Radiology:  No results found.  Cardiac Studies:   EKG 02/07/2020: Normal sinus rhythm at a rate of 76 bpm, hyperacute T waves changes in the anterolateral leads in V1 to V4 suggestive of acute STEMI in the anterolateral wall.  This is new compared to prior EKG from 2019.  Assessment   1.  Chest pain with EKG abnormality suggestive of STEMI anterolateral wall. 2.  Primary hypertension 3.  Diabetes mellitus type 2 controlled  Recommendations:  Patient is presently having active chest pain.  Bilateral upper extremity pulses are equal.  EKG is very suggestive of ACS.  Will emergently take him to the cardiac catheterization lab.  Schedule for cardiac catheterization, and possible angioplasty. We discussed regarding risks, benefits, alternatives to this including stress testing, CTA and continued medical therapy. Patient wants to  proceed. Understands <1-2% risk of death, stroke, MI, urgent CABG, bleeding, infection, renal failure but not limited to these.    Yates Decamp, MD, [facility] 02/07/2020, 8:31 PM Office: [phone] Pager: [phone]

## 2020-02-07 NOTE — ED Notes (Signed)
4000u IV heparin given

## 2020-02-08 ENCOUNTER — Encounter (HOSPITAL_COMMUNITY): Payer: Self-pay | Admitting: Cardiology

## 2020-02-08 ENCOUNTER — Telehealth: Payer: Self-pay | Admitting: Cardiology

## 2020-02-08 ENCOUNTER — Inpatient Hospital Stay (HOSPITAL_COMMUNITY): Payer: Medicare HMO

## 2020-02-08 LAB — POCT I-STAT, CHEM 8
BUN: 14 mg/dL (ref 8–23)
Calcium, Ion: 1.19 mmol/L (ref 1.15–1.40)
Chloride: 107 mmol/L (ref 98–111)
Creatinine, Ser: 0.8 mg/dL (ref 0.61–1.24)
Glucose, Bld: 166 mg/dL — ABNORMAL HIGH (ref 70–99)
HCT: 40 % (ref 39.0–52.0)
Hemoglobin: 13.6 g/dL (ref 13.0–17.0)
Potassium: 3.3 mmol/L — ABNORMAL LOW (ref 3.5–5.1)
Sodium: 140 mmol/L (ref 135–145)
TCO2: 20 mmol/L — ABNORMAL LOW (ref 22–32)

## 2020-02-08 LAB — ECHOCARDIOGRAM COMPLETE
Area-P 1/2: 4.1 cm2
Height: 70 in
S' Lateral: 3.7 cm
Weight: 3791.91 oz

## 2020-02-08 LAB — BASIC METABOLIC PANEL
Anion gap: 12 (ref 5–15)
BUN: 15 mg/dL (ref 8–23)
CO2: 23 mmol/L (ref 22–32)
Calcium: 9.1 mg/dL (ref 8.9–10.3)
Chloride: 104 mmol/L (ref 98–111)
Creatinine, Ser: 1.06 mg/dL (ref 0.61–1.24)
GFR, Estimated: >60 mL/min (ref >60)
Glucose, Bld: 325 mg/dL — ABNORMAL HIGH (ref 70–99)
Potassium: 4.9 mmol/L (ref 3.5–5.1)
Sodium: 139 mmol/L (ref 135–145)

## 2020-02-08 LAB — CBC
HCT: 42.8 % (ref 39.0–52.0)
Hemoglobin: 14.3 g/dL (ref 13.0–17.0)
MCH: 31 pg (ref 26.0–34.0)
MCHC: 33.4 g/dL (ref 30.0–36.0)
MCV: 92.6 fL (ref 80.0–100.0)
Platelets: 275 10*3/uL (ref 150–400)
RBC: 4.62 MIL/uL (ref 4.22–5.81)
RDW: 13.8 % (ref 11.5–15.5)
WBC: 8 10*3/uL (ref 4.0–10.5)
nRBC: 0 % (ref 0.0–0.2)

## 2020-02-08 LAB — TROPONIN I (HIGH SENSITIVITY): Troponin I (High Sensitivity): 2996 ng/L (ref <18)

## 2020-02-08 LAB — POCT ACTIVATED CLOTTING TIME: Activated Clotting Time: 323 seconds

## 2020-02-08 MED ORDER — LOSARTAN POTASSIUM 50 MG PO TABS
50.0000 mg | ORAL_TABLET | Freq: Every evening | ORAL | 2 refills | Status: DC
Start: 2020-02-08 — End: 2020-03-15

## 2020-02-08 MED ORDER — METOPROLOL TARTRATE 50 MG PO TABS
50.0000 mg | ORAL_TABLET | Freq: Two times a day (BID) | ORAL | Status: DC
  Administered 2020-02-08: 50 mg via ORAL
  Filled 2020-02-08: qty 1

## 2020-02-08 MED ORDER — METOPROLOL TARTRATE 50 MG PO TABS
50.0000 mg | ORAL_TABLET | Freq: Two times a day (BID) | ORAL | 1 refills | Status: DC
Start: 2020-02-08 — End: 2020-02-15

## 2020-02-08 MED ORDER — ASPIRIN 81 MG PO CHEW: 81.0000 mg | CHEWABLE_TABLET | Freq: Every day | ORAL | Status: AC

## 2020-02-08 MED ORDER — TICAGRELOR 90 MG PO TABS
90.0000 mg | ORAL_TABLET | Freq: Two times a day (BID) | ORAL | 3 refills | Status: DC
Start: 2020-02-08 — End: 2020-04-15

## 2020-02-08 MED ORDER — NITROGLYCERIN 0.4 MG SL SUBL: 0.4000 mg | SUBLINGUAL_TABLET | SUBLINGUAL | 1 refills | Status: DC | PRN

## 2020-02-08 MED ORDER — LOSARTAN POTASSIUM 50 MG PO TABS: 50.0000 mg | ORAL_TABLET | Freq: Every evening | ORAL | Status: DC

## 2020-02-08 MED FILL — Methylprednisolone Sod Succ For Inj 125 MG (Base Equiv): INTRAMUSCULAR | Qty: 2 | Status: AC

## 2020-02-08 MED FILL — Nitroglycerin IV Soln 100 MCG/ML in D5W: INTRA_ARTERIAL | Qty: 10 | Status: AC

## 2020-02-08 MED FILL — NITROGLYCERIN 0.4 MG TAB SL: 0.4 | 7 days supply | Qty: 25 | Fill #0 | Status: TO

## 2020-02-08 MED FILL — BRILINTA 90 MG TABLET: 90 | 30 days supply | Qty: 60 | Fill #0 | Status: TO

## 2020-02-08 MED FILL — LOSARTAN POTASSIUM 50 MG TA: 50 | 30 days supply | Qty: 30 | Fill #0 | Status: TO

## 2020-02-08 MED FILL — METOPROLOL TARTRATE 50 MG T: 50 | 30 days supply | Qty: 60 | Fill #0 | Status: TO

## 2020-02-08 NOTE — Plan of Care (Signed)
Problem: Education: Goal: Understanding of cardiac disease, CV risk reduction, and recovery process will improve 02/08/2020 1439 by Minerva Fester, RN Outcome: Adequate for Discharge 02/08/2020 0817 by Minerva Fester, RN Outcome: Progressing Goal: Understanding of medication regimen will improve 02/08/2020 1439 by Minerva Fester, RN Outcome: Adequate for Discharge 02/08/2020 0817 by Minerva Fester, RN Outcome: Progressing Goal: Individualized Educational Video(s) 02/08/2020 1439 by Minerva Fester, RN Outcome: Adequate for Discharge 02/08/2020 0817 by Minerva Fester, RN Outcome: Progressing   Problem: Activity: Goal: Ability to tolerate increased activity will improve 02/08/2020 1439 by Minerva Fester, RN Outcome: Adequate for Discharge 02/08/2020 0817 by Minerva Fester, RN Outcome: Progressing   Problem: Cardiac: Goal: Ability to achieve and maintain adequate cardiopulmonary perfusion will improve 02/08/2020 1439 by Minerva Fester, RN Outcome: Adequate for Discharge 02/08/2020 0817 by Minerva Fester, RN Outcome: Progressing Goal: Vascular access site(s) Level 0-1 will be maintained 02/08/2020 1439 by Minerva Fester, RN Outcome: Adequate for Discharge 02/08/2020 0817 by Minerva Fester, RN Outcome: Progressing   Problem: Health Behavior/Discharge Planning: Goal: Ability to safely manage health-related needs after discharge will improve 02/08/2020 1439 by Minerva Fester, RN Outcome: Adequate for Discharge 02/08/2020 0817 by Minerva Fester, RN Outcome: Progressing   Problem: Education: Goal: Knowledge of General Education information will improve Description: Including pain rating scale, medication(s)/side effects and non-pharmacologic comfort measures 02/08/2020 1439 by Minerva Fester, RN Outcome: Adequate for Discharge 02/08/2020 0817 by Minerva Fester, RN Outcome: Progressing   Problem: Health Behavior/Discharge Planning: Goal:  Ability to manage health-related needs will improve 02/08/2020 1439 by Minerva Fester, RN Outcome: Adequate for Discharge 02/08/2020 0817 by Minerva Fester, RN Outcome: Progressing   Problem: Clinical Measurements: Goal: Ability to maintain clinical measurements within normal limits will improve 02/08/2020 1439 by Minerva Fester, RN Outcome: Adequate for Discharge 02/08/2020 0817 by Minerva Fester, RN Outcome: Progressing Goal: Will remain free from infection 02/08/2020 1439 by Minerva Fester, RN Outcome: Adequate for Discharge 02/08/2020 0817 by Minerva Fester, RN Outcome: Progressing Goal: Diagnostic test results will improve 02/08/2020 1439 by Minerva Fester, RN Outcome: Adequate for Discharge 02/08/2020 0817 by Minerva Fester, RN Outcome: Progressing Goal: Respiratory complications will improve 02/08/2020 1439 by Minerva Fester, RN Outcome: Adequate for Discharge 02/08/2020 0817 by Minerva Fester, RN Outcome: Progressing Goal: Cardiovascular complication will be avoided 02/08/2020 1439 by Minerva Fester, RN Outcome: Adequate for Discharge 02/08/2020 0817 by Minerva Fester, RN Outcome: Progressing   Problem: Activity: Goal: Risk for activity intolerance will decrease 02/08/2020 1439 by Minerva Fester, RN Outcome: Adequate for Discharge 02/08/2020 0817 by Minerva Fester, RN Outcome: Progressing   Problem: Nutrition: Goal: Adequate nutrition will be maintained 02/08/2020 1439 by Minerva Fester, RN Outcome: Adequate for Discharge 02/08/2020 0817 by Minerva Fester, RN Outcome: Progressing   Problem: Coping: Goal: Level of anxiety will decrease 02/08/2020 1439 by Minerva Fester, RN Outcome: Adequate for Discharge 02/08/2020 0817 by Minerva Fester, RN Outcome: Progressing   Problem: Elimination: Goal: Will not experience complications related to bowel motility 02/08/2020 1439 by Minerva Fester, RN Outcome: Adequate for  Discharge 02/08/2020 0817 by Minerva Fester, RN Outcome: Progressing Goal: Will not experience complications related to urinary retention 02/08/2020 1439 by Minerva Fester, RN Outcome: Adequate for Discharge 02/08/2020 0817 by Minerva Fester, RN Outcome: Progressing   Problem: Pain Managment: Goal: General experience of comfort  will improve 02/08/2020 1439 by Minerva Fester, RN Outcome: Adequate for Discharge 02/08/2020 0817 by Minerva Fester, RN Outcome: Progressing   Problem: Safety: Goal: Ability to remain free from injury will improve 02/08/2020 1439 by Minerva Fester, RN Outcome: Adequate for Discharge 02/08/2020 0817 by Minerva Fester, RN Outcome: Progressing   Problem: Skin Integrity: Goal: Risk for impaired skin integrity will decrease 02/08/2020 1439 by Minerva Fester, RN Outcome: Adequate for Discharge 02/08/2020 0817 by Minerva Fester, RN Outcome: Progressing

## 2020-02-08 NOTE — Progress Notes (Signed)
  Echocardiogram 2D Echocardiogram has been performed.  Delcie Roch 02/08/2020, 1:50 PM

## 2020-02-08 NOTE — Discharge Instructions (Signed)
Heart Attack The heart is a muscle that needs oxygen to survive. A heart attack is a condition that occurs when your heart does not get enough oxygen. When this happens, the heart muscle begins to die. This can cause permanent damage if not treated right away. A heart attack is a medical emergency. This condition may be called a myocardial infarction, or MI. It is also known as acute coronary syndrome (ACS). ACS is a term used to describe a group of conditions that affect blood flow to the heart. What are the causes? This condition may be caused by:  Atherosclerosis. This occurs when a fatty substance called plaque builds up in the arteries and blocks or reduces blood supply to the heart.  A blood clot. A blood clot can develop suddenly when plaque breaks up within an artery and blocks blood flow to the heart.  Low blood pressure.  An abnormal heartbeat (arrhythmia).  Conditions that cause a decrease of oxygen to the heart, such as anemiaorrespiratory failure.  A spasm, or severe tightening, of a blood vessel that cuts off blood flow to the heart.  Tearing of a coronary artery (spontaneous coronary artery dissection).  High blood pressure. What increases the risk? The following factors may make you more likely to develop this condition:  Aging. The older you are, the higher your risk.  Having a personal or family history of chest pain, heart attack, stroke, or narrowing of the arteries in the legs, arms, head, or stomach (peripheral artery disease).  Being male.  Smoking.  Not getting regular exercise.  Being overweight or obese.  Having high blood pressure.  Having high cholesterol (hypercholesterolemia).  Having diabetes.  Drinking too much alcohol.  Using illegal drugs, such as cocaine or methamphetamine. What are the signs or symptoms? Symptoms of this condition may vary, depending on factors like gender and age. Symptoms may include:  Chest pain. It may feel  like: ? Crushing or squeezing. ? Tightness, pressure, fullness, or heaviness.  Pain in the arm, neck, jaw, back, or upper body.  Shortness of breath.  Heartburn or upset stomach.  Nausea.  Sudden cold sweats.  Feeling tired.  Sudden light-headedness. How is this diagnosed? This condition may be diagnosed through tests, such as:  Electrocardiogram (ECG) to measure the electrical activity of your heart.  Blood tests to check for cardiac markers. These chemicals are released by a damaged heart muscle.  A test to evaluate blood flow and heart function (coronary angiogram).  CT scan to see the heart more clearly.  A test to evaluate the pumping action of the heart (echocardiogram). How is this treated? A heart attack must be treated as soon as possible. Treatment may include:  Medicines to: ? Break up or dissolve blood clots (fibrinolytic therapy). ? Thin blood and help prevent blood clots. ? Treat blood pressure. ? Improve blood flow to the heart. ? Reduce pain. ? Reduce cholesterol.  Angioplasty and stent placement. These are procedures to widen a blocked artery and keep it open.  Coronary artery bypass graft, CABG, or open heart surgery. This enables blood to flow to the heart by going around the blocked part of the artery.  Oxygen therapy if needed.  Cardiac rehabilitation. This improves your health and well-being through exercise, education, and counseling. Follow these instructions at home: Medicines  Take over-the-counter and prescription medicines only as told by your health care provider.  Do not take the following medicines unless your health care provider says it is okay   to take them: ? NSAIDs, such as ibuprofen. ? Supplements that contain vitamin A, vitamin E, or both. ? Hormone replacement therapy that contains estrogen with or without progestin. Lifestyle   Do not use any products that contain nicotine or tobacco, such as cigarettes, e-cigarettes,  and chewing tobacco. If you need help quitting, ask your health care provider.  Avoid secondhand smoke.  Exercise regularly. Ask your health care provider about participating in a cardiac rehabilitation program that helps you start exercising safely after a heart attack.  Eat a heart-healthy diet. Your health care provider will tell you what foods to eat.  Maintain a healthy weight.  Learn ways to manage stress.  Do not use illegal drugs. Alcohol use  Do not drink alcohol if: ? Your health care provider tells you not to drink. ? You are pregnant, may be pregnant, or are planning to become pregnant.  If you drink alcohol: ? Limit how much you use to:  0-1 drink a day for women.  0-2 drinks a day for men. ? Be aware of how much alcohol is in your drink. In the [place] one drink equals one 12 oz bottle of beer (355 mL), one 5 oz glass of wine (148 mL), or one 1 oz glass of hard liquor (44 mL). General instructions  Work with your health care provider to manage any other conditions you have, such as high blood pressure or diabetes. These conditions affect your heart.  Get screened for depression, and seek treatment if needed.  Keep your vaccinations up to date. Get the flu vaccine every year.  Keep all follow-up visits as told by your health care provider. This is important. Contact a health care provider if:  You feel overwhelmed or sad.  You have trouble doing your daily activities. Get help right away if:  You have sudden, unexplained discomfort in your chest, arms, back, neck, jaw, or upper body.  You have shortness of breath.  You suddenly start to sweat or your skin gets clammy.  You feel nauseous or you vomit.  You have unexplained tiredness or weakness.  You suddenly feel light-headed or dizzy.  You notice your heart starts to beat fast or feels like it is skipping beats.  You have blood pressure that is higher than 180/120. These symptoms may represent a  serious problem that is an emergency. Do not wait to see if the symptoms will go away. Get medical help right away. Call your local emergency services (911 in the [place] Do not drive yourself to the hospital. Summary  A heart attack, also called myocardial infarction, is a condition that occurs when your heart does not get enough oxygen. This is caused by anything that blocks or reduces blood flow to the heart.  Treatment is a combination of medicines and surgeries, if needed, to open the blocked arteries and restore blood flow to the heart.  A heart attack is an emergency. Get help right away if you have sudden discomfort in your chest, arms, back, neck, jaw, or upper body. Seek help if you feel nauseous, you vomit, or you feel light-headed or dizzy. This information is not intended to replace advice given to you by your health care provider. Make sure you discuss any questions you have with your health care provider. Document Revised: 06/19/2018 Document Reviewed: 06/23/2018 Elsevier Patient Education  2020 Elsevier Inc.  

## 2020-02-08 NOTE — TOC Initial Note (Signed)
Transition of Care [facility]) - Initial/Assessment Note    Patient Details  Name: Cory Sosa MRN: [id number] Date of Birth: December 07, 1955  Transition of Care [facility]) CM/SW Contact:    Curlene Labrum, RN Phone Number: 02/08/2020, 11:35 AM  Clinical Narrative:                 Case management met with the patient and wife at the bedside prior to his discharge home today.  The patient received discharge medications through the [facility] and has a [facility] medication assistance card at the bedside.  The patient plans to discharge home with his wife, who is driving him home today.  No other discharge needs are noted at this time.  Expected Discharge Plan: Home/Self Care Barriers to Discharge: No Barriers Identified   Patient Goals and CMS Choice Patient states their goals for this hospitalization and ongoing recovery are:: Patient plans to discharge home with his wife today. CMS Medicare.gov Compare Post Acute Care list provided to:: Patient Choice offered to / list presented to : Patient  Expected Discharge Plan and Services Expected Discharge Plan: Home/Self Care   Discharge Planning Services: CM Consult   Living arrangements for the past 2 months: Single Family Home                                      Prior Living Arrangements/Services Living arrangements for the past 2 months: Single Family Home Lives with:: Spouse   Do you feel safe going back to the place where you live?: Yes      Need for Family Participation in Patient Care: Yes (Comment) Care giver support system in place?: Yes (comment)   Criminal Activity/Legal Involvement Pertinent to Current Situation/Hospitalization: No - Comment as needed  Activities of Daily Living Home Assistive Devices/Equipment: Grab bars in shower, CPAP ADL Screening (condition at time of admission) Patient's cognitive ability adequate to safely complete daily activities?: Yes Is the patient deaf or have difficulty hearing?:  No Does the patient have difficulty seeing, even when wearing glasses/contacts?: No Does the patient have difficulty concentrating, remembering, or making decisions?: No Patient able to express need for assistance with ADLs?: Yes Does the patient have difficulty dressing or bathing?: No Independently performs ADLs?: Yes (appropriate for developmental age) Does the patient have difficulty walking or climbing stairs?: Yes Weakness of Legs: Left Weakness of Arms/Hands: None  Permission Sought/Granted Permission sought to share information with : Case Manager Permission granted to share information with : Yes, Verbal Permission Granted     Permission granted to share info w AGENCY: TOC pharmacy for discharge medications  Permission granted to share info w Relationship: spouse     Emotional Assessment Appearance:: Appears stated age Attitude/Demeanor/Rapport: Gracious Affect (typically observed): Accepting Orientation: : Oriented to Self, Oriented to Place, Oriented to  Time, Oriented to Situation Alcohol / Substance Use: Not Applicable Psych Involvement: No (comment)  Admission diagnosis:  Acute MI anterior wall first episode care [facility]) [I21.09] Patient Active Problem List   Diagnosis Date Noted  . Acute ST elevation myocardial infarction (STEMI) of anterolateral wall ([facility]) 02/07/2020  . Acute MI anterior wall first episode care [facility]) 02/07/2020   PCP:  Chesley Noon, MD Pharmacy:   [facility], [place] - [address] [place] [address] [place] [address] Phone: [phone] Fax:  [facility] [place], [place] - [address] [place] [address] Phone: [phone] Fax: [phone]     Social Determinants of Health ([place]) Interventions    Readmission Risk Interventions Readmission Risk Prevention Plan 02/08/2020  Post Dischage  Appt Complete  Medication Screening Complete  Transportation Screening Complete

## 2020-02-08 NOTE — Progress Notes (Signed)
CARDIAC REHAB PHASE I   PRE:  Rate/Rhythm: 64 SR  BP:  Sitting: 141/122      SaO2: 95 RA  MODE:  Ambulation: 370 ft   POST:  Rate/Rhythm: 77 SR  BP:  Sitting: 155/93    SaO2: 97 RA   Pt ambulated 360ft in hallway standby assist with steady gait. Pt denies CP, SOB, or dizziness. Pt educated on importance of ASA, Brilinta, statin, and NTG. Pt given MI book along with heart healthy and diabetic diets. Reviewed site care, restrictions, and exercise guidelines. Will refer to CRP II [place].   [id number] Reynold Bowen, RN BSN 02/08/2020 10:12 AM

## 2020-02-08 NOTE — Plan of Care (Signed)
  Problem: Education: Goal: Understanding of cardiac disease, CV risk reduction, and recovery process will improve Outcome: Progressing Goal: Understanding of medication regimen will improve Outcome: Progressing Goal: Individualized Educational Video(s) Outcome: Progressing   Problem: Activity: Goal: Ability to tolerate increased activity will improve Outcome: Progressing   Problem: Cardiac: Goal: Ability to achieve and maintain adequate cardiopulmonary perfusion will improve Outcome: Progressing Goal: Vascular access site(s) Level 0-1 will be maintained Outcome: Progressing   Problem: Cardiac: Goal: Vascular access site(s) Level 0-1 will be maintained Outcome: Progressing   Problem: Health Behavior/Discharge Planning: Goal: Ability to safely manage health-related needs after discharge will improve Outcome: Progressing   Problem: Education: Goal: Knowledge of General Education information will improve Description: Including pain rating scale, medication(s)/side effects and non-pharmacologic comfort measures Outcome: Progressing

## 2020-02-08 NOTE — Discharge Summary (Signed)
Physician Discharge Summary  Patient ID: Cory Sosa MRN: [id number] DOB/AGE: 64/09/1955 64 y.o. Cory InchBadger, Michael C, MD   Admit date: 02/07/2020 Discharge date: 02/08/2020  Primary Discharge Diagnosis 1.  Acute ST elevation MI, anterolateral leads, SP successful PCI and stenting to proximal and mid LAD with implantation of 2 overlapping 4.0 x 38 and 3.5 x 12 mm DES 2.  Diabetes mellitus type 2 on insulin, without complications, with hyperglycemia. 3.  Primary hypertension 4.  Mixed hyperlipidemia 5.  Mild obesity  Significant Diagnostic Studies:  Coronary angiography and angioplasty 02/07/2020: RCA: Dominant, mild disease throughout. Left main: Smooth and normal. Circumflex: Moderate sized, mild diffuse disease. Ramus intermediate: Mild disease.  Small. LAD: Large vessel.  Occluded after the origin of a large D1.  Successful thrombectomy followed by overlapping 4.0 x 38 resolute Onyx and 3.5 x 12 mm Synergy DES deployed at 12 atmospheric pressure each, distal stent was postdilated with the same stent balloon at 16 atmospheric pressure at the overlap.  TIMI 0 to TIMI III flow improved, no evidence of edge dissection.  100 mL contrast utilized.  Recommendation: Patient will be observed in the intensive care unit.  He will be started on dual antiplatelet therapy and aggressive risk factor modification will be continued.  Echocardiogram 02/08/2020:   1. Left ventricular ejection fraction, by estimation, is 40 to 45%. The left ventricle has mildly decreased function. The left ventricle demonstrates regional wall motion abnormalities (see scoring diagram/findings for description). There is mild left  ventricular hypertrophy. Left ventricular diastolic parameters were normal. There is mild hypokinesis of the left ventricular, mid-apical anterolateral wall and anterior wall.  2. Right ventricular systolic function is normal. The right ventricular size is normal. There is normal pulmonary artery  systolic pressure.  3. Left atrial size was mildly dilated.  4. The mitral valve is normal in structure. No evidence of mitral valve regurgitation.  5. The aortic valve is tricuspid. There is mild calcification of the aortic valve. There is mild thickening of the aortic valve. Aortic valve regurgitation is not visualized.  Radiology:  DG Chest Portable 1 View 02/07/2020 CLINICAL DATA:  Chest pain, dizziness, nausea EXAM: PORTABLE CHEST 1 VIEW COMPARISON:  03/03/2018 FINDINGS: Single frontal view of the chest demonstrates an unremarkable cardiac silhouette. No airspace disease, effusion, or pneumothorax. No acute bony abnormality.   EKG:  EKG 02/08/2020: Normal sinus rhythm/sinus bradycardia at rate of 59 bpm, normal axis, nonspecific T abnormality in the anterolateral leads.  EKG 02/07/2020: Normal sinus rhythm at a rate of 76 bpm, hyperacute T waves changes in the anterolateral leads in V1 to V4 suggestive of acute STEMI in the anterolateral wall.  This is new compared to prior EKG from 2019.  Hospital Course: Cory FantasiaJohn Sosa is a 64 y.o. male  patient with hypertension, hyperlipidemia, diabetes mellitus, presented via EMS to the emergency room with ongoing chest pain that started approximately an hour before presentation to the ED.  He was found to have hyperacute T wave abnormality with minimal ST elevation in anterolateral leads and hence with ongoing chest discomfort was emergently taken to the cardiac catheterization lab and found to have an occluded proximal to mid segment LAD.  He had excellent results, remained asymptomatic without any arrhythmias, EKG essentially has completely normalized except for nonspecific T abnormality.  Expect his LVEF to improve as he presented fairly early.  I will also repeat second troponin levels for documentation.  Blood pressure is elevated, metoprolol was added onto his medical regimen and losartan  dose was increased.  Discussions regarding hypertriglyceridemia  was held and weight loss discussed.  Recommendations on discharge: Patient has done remarkably well.  Due to faster than expected recovery time, vital signs being stable, echocardiogram revealing mild decrease in LVEF, I felt that he could be discharged home with very close monitoring in our office within a week of discharge.  Patient will need to be on dual antiplatelet therapy with aspirin 81 mg along with Brilinta 90 mg p.o. twice daily for at least 1 year in view of ACS.   He needs aggressive diabetes control.  Triglyceride elevation is related to uncontrolled diabetes mellitus.  Discharge Exam: Vitals with BMI 02/08/2020 02/08/2020 02/08/2020  Height - - -  Weight - - -  BMI - - -  Systolic 146 136 073  Diastolic 101 76 69  Pulse - - 61    Body mass index is 34.01 kg/m.   Physical Exam Constitutional:      Appearance: He is obese.  Eyes:     Extraocular Movements: Extraocular movements intact.  Cardiovascular:     Rate and Rhythm: Normal rate and regular rhythm.     Pulses: Intact distal pulses.     Heart sounds: Normal heart sounds. No murmur heard.  No gallop.      Comments: No leg edema, no JVD. Pulmonary:     Effort: Pulmonary effort is normal.     Breath sounds: Normal breath sounds.  Abdominal:     General: Bowel sounds are normal.     Palpations: Abdomen is soft.  Musculoskeletal:     Cervical back: Neck supple.  Skin:    General: Skin is warm.  Neurological:     General: No focal deficit present.     Mental Status: He is alert and oriented to person, place, and time.  Psychiatric:        Mood and Affect: Mood normal.        Behavior: Behavior normal.   Right radial arterial access site without any complications, mild ecchymosis noted. Labs:   Lab Results  Component Value Date   WBC 8.0 02/08/2020   HGB 14.3 02/08/2020   HCT 42.8 02/08/2020   MCV 92.6 02/08/2020   PLT 275 02/08/2020    Recent Labs  Lab 02/07/20 2018 02/07/20 2018 02/08/20 0612   NA 140   < > 139  K 3.5   < > 4.9  CL 105   < > 104  CO2 24   < > 23  BUN 14   < > 15  CREATININE 0.97   < > 1.06  CALCIUM 9.5   < > 9.1  PROT 6.8  --   --   BILITOT 0.5  --   --   ALKPHOS 57  --   --   ALT 72*  --   --   AST 45*  --   --   GLUCOSE 178*   < > 325*   < > = values in this interval not displayed.   Lipid Panel     Component Value Date/Time   CHOL 116 02/07/2020 2018   TRIG 207 (H) 02/07/2020 2018   HDL 42 02/07/2020 2018   CHOLHDL 2.8 02/07/2020 2018   VLDL 41 (H) 02/07/2020 2018   LDLCALC 33 02/07/2020 2018    BNP (last 3 results) No results for input(s): BNP in the last 8760 hours.  HEMOGLOBIN A1C Lab Results  Component Value Date   HGBA1C 8.1 (H) 02/07/2020  MPG 185.77 02/07/2020    Cardiac Panel   TSH No results for input(s): TSH in the last 8760 hours.  FOLLOW UP PLANS AND APPOINTMENTS Discharge Instructions    Amb Referral to Cardiac Rehabilitation   Complete by: As directed    Diagnosis:  Coronary Stents STEMI     After initial evaluation and assessments completed: Virtual Based Care may be provided alone or in conjunction with Phase 2 Cardiac Rehab based on patient barriers.: Yes     Allergies as of 02/08/2020      Reactions   Contrast Media [iodinated Diagnostic Agents] Nausea And Vomiting   Rocephin [ceftriaxone Sodium In Dextrose] Nausea And Vomiting      Medication List    STOP taking these medications   ALLERGY EYE OP   aspirin EC 81 MG tablet Replaced by: aspirin 81 MG chewable tablet   ibuprofen 200 MG tablet Commonly known as: ADVIL   PRESCRIPTION MEDICATION   PRESCRIPTION MEDICATION     TAKE these medications   aspirin 81 MG chewable tablet Chew 1 tablet (81 mg total) by mouth daily. Replaces: aspirin EC 81 MG tablet   ergocalciferol 1.25 MG (50000 UT) capsule Commonly known as: VITAMIN D2 Take 50,000 Units by mouth once a week. Sunday What changed: Another medication with the same name was removed.  Continue taking this medication, and follow the directions you see here.   losartan 50 MG tablet Commonly known as: COZAAR Take 1 tablet (50 mg total) by mouth every evening. What changed:   medication strength  how much to take  when to take this   metFORMIN 500 MG 24 hr tablet Commonly known as: GLUCOPHAGE-XR Take 2 tablets (1,000 mg total) by mouth 2 (two) times daily with a meal. Start taking on: February 10, 2020 What changed: These instructions start on February 10, 2020. If you are unsure what to do until then, ask your doctor or other care provider.   metoprolol tartrate 50 MG tablet Commonly known as: LOPRESSOR Take 1 tablet (50 mg total) by mouth 2 (two) times daily.   Multiple Vitamin tablet Take 1 tablet by mouth daily.   nitroGLYCERIN 0.4 MG SL tablet Commonly known as: NITROSTAT Place 1 tablet (0.4 mg total) under the tongue every 5 (five) minutes as needed for chest pain.   NovoLIN 70/30 FlexPen (70-30) 100 UNIT/ML KwikPen Generic drug: insulin isophane & regular human Inject 55 Units into the skin 2 (two) times daily.   NovoLOG FlexPen 100 UNIT/ML FlexPen Generic drug: insulin aspart Inject 24 Units into the skin 3 (three) times daily with meals. And 10 units with a snack   nystatin ointment Commonly known as: MYCOSTATIN Apply 1 application topically 2 (two) times daily as needed. Balanitis   pantoprazole 40 MG tablet Commonly known as: PROTONIX Take 40 mg by mouth daily.   rosuvastatin 20 MG tablet Commonly known as: CRESTOR Take 20 mg by mouth at bedtime.   testosterone cypionate 100 MG/ML injection Commonly known as: DEPOTESTOTERONE CYPIONATE Inject 100 mg into the muscle every 14 (fourteen) days.   ticagrelor 90 MG Tabs tablet Commonly known as: BRILINTA Take 1 tablet (90 mg total) by mouth 2 (two) times daily.   Toujeo Max SoloStar 300 UNIT/ML Solostar Pen Generic drug: insulin glargine (2 Unit Dial) Inject 60 Units into the skin  daily.   VITAMIN B-12 PO Take 2 tablets by mouth at bedtime.   zolpidem 10 MG tablet Commonly known as: AMBIEN Take 5 mg by mouth at  bedtime as needed for sleep.       Follow-up Information    Rayford Halsted, PA-Sosa Follow up on 02/15/2020.   Specialty: Cardiology Why: Bring all medication. 10 AM appointment Contact information: [address] [place] [address] [phone]        Cory Inch, MD. Schedule an appointment as soon as possible for a visit.   Specialty: Family Medicine Why: Please make an appointment to follow up with your primary physican in the next 7-10 days for a hospital follow up. Contact information: [place] [address] [place] [address] [phone]                Yates Decamp, MD, [facility] 02/08/2020, 2:19 PM Office: [phone]

## 2020-02-08 NOTE — Telephone Encounter (Signed)
Patient is need a TOC. He is getting out of the hospital today so JG said to call him tomorrow. His appointment date is 02/22/20 @ 10:00 with CC

## 2020-02-08 NOTE — Procedures (Signed)
Came bedside for echo twice, but patient working with other staff both times.

## 2020-02-10 ENCOUNTER — Telehealth (HOSPITAL_COMMUNITY): Payer: Self-pay | Admitting: Pharmacist

## 2020-02-10 NOTE — Telephone Encounter (Signed)
Pharmacy Transitions of Care Follow-up Telephone Call  Date of discharge: 02/08/2020 Discharge Diagnosis: STEMI  Medication changes made at discharge:  NEW: Brilenta 90 mg BID, metoprolol tartrate 50 mg BID, nitroglycerin 0.4 mg as needed  CHANGED: losartan increased to 50 mg daily  STOPPED: Advil   Medication changes obtained and verified? Was not able to verify this change with the patient     Medication Accessibility:  Home Pharmacy: [facility] [phone] in [place], [place]   Was the patient provided with refills on discharged medications? Yes  Have all prescriptions been transferred from [facility] to home pharmacy? Yes  Is the patient able to afford medications? Yes . Notable copays: Esperanza Heir represents a $0 copay per month . Eligible patient assistance: none    Medication Review:  TICAGRELOR (BRILINTA) Ticagrelor 90 mg twice daily. - Educated patient on expected duration of therapy with ticagrelor for 1 year. Aspirin will be continued indefinitely. - Discussed importance of taking medication around the same time every day, - Reviewed potential DDIs with patient - Advised patient of medications to avoid (NSAIDs, aspirin maintenance doses>100 mg daily) - Educated that Tylenol (acetaminophen) will be the preferred analgesic to prevent risk of bleeding  - Emphasized importance of monitoring for signs and symptoms of bleeding (abnormal bruising, prolonged bleeding, nose bleeds, bleeding from gums, discolored urine, black tarry stools)  - Educated patient to notify doctor if shortness of breath or abnormal heartbeat occur - Advised patient to alert all providers of antiplatelet therapy prior to starting a new medication or having a procedure   Follow-up Appointments:  PCP Hospital f/u appt - Scheduled to see Dr. Cyndia Bent on 02/15/2020 @ 2:45 pm.   [facility] Hospital f/u appt - Scheduled to see Elvin So on 02/15/2020 @ 10:00 am.   Final Patient  Assessment: Patient was not able to be reached. Patient did return phone call and left voicemail to please transfer prescriptions to the [facility]. I attempted to return call two other times without success. Will go ahead and complete transfers today.   Theodis Sato, PharmD PGY-1 [facility] Pharmacy Resident   02/10/2020 3:51 PM

## 2020-02-11 ENCOUNTER — Telehealth (HOSPITAL_COMMUNITY): Payer: Self-pay

## 2020-02-11 NOTE — Telephone Encounter (Signed)
Pt insurance is active and benefits verified through [facility]. Co-pay $10.00, DED $0.00/$0.00 met, out of pocket $3,900.00/$325.00 met, co-insurance 0%. No pre-authorization required. Lina/[employer], 02/11/20 @ 1126AM, [phone]  Will contact patient to see if he is interested in the Cardiac Rehab Program. If interested, patient will need to complete follow up appt. Once completed, patient will be contacted for scheduling upon review by the RN Navigator.

## 2020-02-11 NOTE — Telephone Encounter (Signed)
Attempted to call patient in regards to Cardiac Rehab - LM on VM 

## 2020-02-15 ENCOUNTER — Ambulatory Visit: Payer: Medicare HMO | Admitting: Student

## 2020-02-15 ENCOUNTER — Encounter: Payer: Self-pay | Admitting: Student

## 2020-02-15 ENCOUNTER — Other Ambulatory Visit: Payer: Self-pay

## 2020-02-15 DIAGNOSIS — I252 Old myocardial infarction: Secondary | ICD-10-CM

## 2020-02-15 DIAGNOSIS — I2510 Atherosclerotic heart disease of native coronary artery without angina pectoris: Secondary | ICD-10-CM

## 2020-02-15 DIAGNOSIS — E782 Mixed hyperlipidemia: Secondary | ICD-10-CM

## 2020-02-15 DIAGNOSIS — E119 Type 2 diabetes mellitus without complications: Secondary | ICD-10-CM

## 2020-02-15 DIAGNOSIS — I10 Essential (primary) hypertension: Secondary | ICD-10-CM

## 2020-02-15 DIAGNOSIS — Z794 Long term (current) use of insulin: Secondary | ICD-10-CM

## 2020-02-15 MED ORDER — CARVEDILOL 6.25 MG PO TABS: 6.2500 mg | ORAL_TABLET | Freq: Two times a day (BID) | ORAL | 3 refills | Status: DC

## 2020-02-15 NOTE — Progress Notes (Addendum)
Primary Physician/Referring:  [name], MD  Patient ID: Cory Sosa, male    DOB: [birth year], 64 y.o.   MRN: [id number]  Chief Complaint  Patient presents with  . Coronary Artery Disease  . Hospitalization Follow-up  . New Patient (Initial Visit)  . Hypertension   HPI:    Cory Sosa  is a 64 y.o. male patient with hypertension, hyperlipidemia, diabetes mellitus, with recent ST elevation MI 02/07/2020 with successful PCI and stenting to the proximal and mid LAD.  Patient presents for hospital follow-up.  He continues to feel fatigued and prior to MI patient was walking 2 miles daily without issue.  However he currently reports he is only walking 5 to 10 minutes a day he denies chest pain, dyspnea, PND, orthopnea, leg swelling.  He does report since starting metoprolol he has been having frequent and sometimes loose stools as well as an irritating cough.  Of note patient has previously been on losartan 25 mg daily, at discharge was instructed to increase to 50 mg daily however he has not done so.  Hospital Discharge Summary 02/08/2020 per Dr. Jacinto Halim:   Hospital Course: Cory Sosa is a 64 y.o. male  patient with hypertension, hyperlipidemia, diabetes mellitus, presented via EMS to the emergency room with ongoing chest pain that started approximately an hour before presentation to the ED.  He was found to have hyperacute T wave abnormality with minimal ST elevation in anterolateral leads and hence with ongoing chest discomfort was emergently taken to the cardiac catheterization lab and found to have an occluded proximal to mid segment LAD.  He had excellent results, remained asymptomatic without any arrhythmias, EKG essentially has completely normalized except for nonspecific T abnormality.  Expect his LVEF to improve as he presented fairly early.  I will also repeat second troponin levels for documentation.  Blood pressure is elevated, metoprolol was added onto his medical regimen and  losartan dose was increased.  Discussions regarding hypertriglyceridemia was held and weight loss discussed.  Recommendations on discharge: Patient has done remarkably well.  Due to faster than expected recovery time, vital signs being stable, echocardiogram revealing mild decrease in LVEF, I felt that he could be discharged home with very close monitoring in our office within a week of discharge.  Patient will need to be on dual antiplatelet therapy with aspirin 81 mg along with Brilinta 90 mg p.o. twice daily for at least 1 year in view of ACS.   He needs aggressive diabetes control.  Triglyceride elevation is related to uncontrolled diabetes mellitus.   Past Medical History:  Diagnosis Date  . Diabetes mellitus without complication (HCC)   . Hypertension    controlled with medications   Past Surgical History:  Procedure Laterality Date  . CORONARY THROMBECTOMY N/A 02/07/2020   Procedure: Coronary Thrombectomy;  Surgeon: Yates Decamp, MD;  Location: [facility] INVASIVE CV LAB;  Service: Cardiovascular;  Laterality: N/A;  . CORONARY/GRAFT ACUTE MI REVASCULARIZATION N/A 02/07/2020   Procedure: Coronary/Graft Acute MI Revascularization;  Surgeon: Yates Decamp, MD;  Location: [facility] INVASIVE CV LAB;  Service: Cardiovascular;  Laterality: N/A;  . LEFT HEART CATH AND CORONARY ANGIOGRAPHY N/A 02/07/2020   Procedure: LEFT HEART CATH AND CORONARY ANGIOGRAPHY;  Surgeon: Yates Decamp, MD;  Location: [facility] INVASIVE CV LAB;  Service: Cardiovascular;  Laterality: N/A;   Family History  Problem Relation Age of Onset  . Pneumonia Mother     Social History   Tobacco Use  . Smoking status: Never Smoker  . Smokeless  tobacco: Never Used  Substance Use Topics  . Alcohol use: Never   Marital Status: Married   ROS  Review of Systems  Constitutional: Positive for malaise/fatigue. Negative for weight gain.  Cardiovascular: Negative for chest pain, claudication, leg swelling, near-syncope, orthopnea, palpitations,  paroxysmal nocturnal dyspnea and syncope.  Respiratory: Negative for shortness of breath.   Hematologic/Lymphatic: Does not bruise/bleed easily.  Gastrointestinal: Negative for melena.  Neurological: Negative for dizziness and weakness.    Objective  Blood pressure 121/77, pulse 65, resp. rate 16, height 5\' 10"  (1.778 m), weight 235 lb (106.6 kg), SpO2 97 %.  Vitals with BMI 02/15/2020 02/08/2020 02/08/2020  Height 5\' 10"  - -  Weight 235 lbs - -  BMI 33.72 - -  Systolic 121 150 02/10/2020  Diastolic 77 82 105  Pulse 65 - -      Physical Exam Vitals reviewed.  HENT:     Head: Normocephalic and atraumatic.  Cardiovascular:     Rate and Rhythm: Normal rate and regular rhythm.     Pulses: Intact distal pulses.     Heart sounds: S1 normal and S2 normal. No murmur heard.  No gallop.      Comments: No JVD, No edema.  Radial access site with ecchymosis and small hematoma. No bruit.  Pulmonary:     Effort: Pulmonary effort is normal. No respiratory distress.     Breath sounds: No wheezing, rhonchi or rales.  Musculoskeletal:     Right lower leg: No edema.     Left lower leg: No edema.  Neurological:     Mental Status: He is alert.     Laboratory examination:   Recent Labs    02/07/20 2018 02/07/20 2050 02/08/20 0612  NA 140 140 139  K 3.5 3.3* 4.9  CL 105 107 104  CO2 24  --  23  GLUCOSE 178* 166* 325*  BUN 14 14 15   CREATININE 0.97 0.80 1.06  CALCIUM 9.5  --  9.1  GFRNONAA >60  --  >60   estimated creatinine clearance is 86 mL/min (by C-G formula based on SCr of 1.06 mg/dL).  CMP Latest Ref Rng & Units 02/08/2020 02/07/2020 02/07/2020  Glucose 70 - 99 mg/dL 02/10/2020) 02/09/2020) 02/09/2020)  BUN 8 - 23 mg/dL 15 14 14   Creatinine 0.61 - 1.24 mg/dL 096(E 454(U 981(X  Sodium 135 - 145 mmol/L 139 140 140  Potassium 3.5 - 5.1 mmol/L 4.9 3.3(L) 3.5  Chloride 98 - 111 mmol/L 104 107 105  CO2 22 - 32 mmol/L 23 - 24  Calcium 8.9 - 10.3 mg/dL 9.1 - 9.5  Total Protein 6.5 - 8.1 g/dL - - 6.8   Total Bilirubin 0.3 - 1.2 mg/dL - - 0.5  Alkaline Phos 38 - 126 U/L - - 57  AST 15 - 41 U/L - - 45(H)  ALT 0 - 44 U/L - - 72(H)   CBC Latest Ref Rng & Units 02/08/2020 02/07/2020 02/07/2020  WBC 4.0 - 10.5 K/uL 8.0 - 10.3  Hemoglobin 13.0 - 17.0 g/dL 9.56 02/10/2020 02/09/2020  Hematocrit 39 - 52 % 42.8 40.0 45.0  Platelets 150 - 400 K/uL 275 - 302    Lipid Panel Recent Labs    02/07/20 2018  CHOL 116  TRIG 207*  LDLCALC 33  VLDL 41*  HDL 42  CHOLHDL 2.8    HEMOGLOBIN A1C Lab Results  Component Value Date   HGBA1C 8.1 (H) 02/07/2020   MPG 185.77 02/07/2020   TSH No results for  input(s): TSH in the last 8760 hours.  External labs:  None   Medications and allergies   Allergies  Allergen Reactions  . Contrast Media [Iodinated Diagnostic Agents] Nausea And Vomiting  . Rocephin [Ceftriaxone Sodium In Dextrose] Nausea And Vomiting     Outpatient Medications Prior to Visit  Medication Sig Dispense Refill  . aspirin 81 MG chewable tablet Chew 1 tablet (81 mg total) by mouth daily.    . Cyanocobalamin (VITAMIN B-12 PO) Take 2 tablets by mouth at bedtime.     . ergocalciferol (VITAMIN D2) 1.25 MG (50000 UT) capsule Take 50,000 Units by mouth once a week. Sunday    . insulin aspart (NOVOLOG FLEXPEN) 100 UNIT/ML FlexPen Inject 24 Units into the skin 3 (three) times daily with meals. And 10 units with a snack    . insulin glargine, 2 Unit Dial, (TOUJEO MAX SOLOSTAR) 300 UNIT/ML Solostar Pen Inject 60 Units into the skin daily.    . Insulin Isophane & Regular Human (NOVOLIN 70/30 FLEXPEN) (70-30) 100 UNIT/ML PEN Inject 55 Units into the skin 2 (two) times daily.     . metFORMIN (GLUCOPHAGE-XR) 500 MG 24 hr tablet Take 1,000 mg by mouth 2 (two) times daily with a meal.   5  . Multiple Vitamin tablet Take 1 tablet by mouth daily.    . nitroGLYCERIN (NITROSTAT) 0.4 MG SL tablet Place 1 tablet (0.4 mg total) under the tongue every 5 (five) minutes as needed for chest pain. 25 tablet 1  .  nystatin ointment (MYCOSTATIN) Apply 1 application topically 2 (two) times daily as needed. Balanitis    . pantoprazole (PROTONIX) 40 MG tablet Take 40 mg by mouth daily.    . rosuvastatin (CRESTOR) 20 MG tablet Take 20 mg by mouth at bedtime.    Marland Kitchen. testosterone cypionate (DEPOTESTOTERONE CYPIONATE) 100 MG/ML injection Inject 100 mg into the muscle every 14 (fourteen) days.   2  . ticagrelor (BRILINTA) 90 MG TABS tablet Take 1 tablet (90 mg total) by mouth 2 (two) times daily. 60 tablet 3  . zolpidem (AMBIEN) 10 MG tablet Take 5 mg by mouth at bedtime as needed for sleep.   2  . metoprolol tartrate (LOPRESSOR) 50 MG tablet Take 1 tablet (50 mg total) by mouth 2 (two) times daily. 60 tablet 1  . losartan (COZAAR) 50 MG tablet Take 1 tablet (50 mg total) by mouth every evening. (Patient not taking: Reported on 02/15/2020) 30 tablet 2   No facility-administered medications prior to visit.     Radiology:   No results found. DG Chest Portable 1 View 02/07/2020 CLINICAL DATA:  Chest pain, dizziness, nausea EXAM: PORTABLE CHEST 1 VIEW COMPARISON:  03/03/2018 FINDINGS: Single frontal view of the chest demonstrates an unremarkable cardiac silhouette. No airspace disease, effusion, or pneumothorax. No acute bony abnormality.   Cardiac Studies:   Coronary angiography and angioplasty 02/07/2020: RCA: Dominant, mild disease throughout. Left main: Smooth and normal. Circumflex: Moderate sized, mild diffuse disease. Ramus intermediate: Mild disease. Small. LAD: Large vessel. Occluded after the origin of a large D1. Successful thrombectomy followed by overlapping 4.0 x 38 resolute Onyx and 3.5 x 12 mm Synergy DES deployed at 12 atmospheric pressure each, distal stent was postdilated with the same stent balloon at 16 atmospheric pressure at the overlap. TIMI 0 to TIMI III flow improved, no evidence of edge dissection. 100 mL contrast utilized.  Recommendation: Patient will be observed in the  intensive care unit. He will be started on dual antiplatelet  therapy and aggressive risk factor modification will be continued.  Echocardiogram 02/08/2020:  1. Left ventricular ejection fraction, by estimation, is 40 to 45%. The left ventricle has mildly decreased function. The left ventricle demonstrates regional wall motion abnormalities (see scoring diagram/findings for description). There is mild left  ventricular hypertrophy. Left ventricular diastolic parameters were normal. There is mild hypokinesis of the left ventricular, mid-apical anterolateral wall and anterior wall. 2. Right ventricular systolic function is normal. The right ventricular size is normal. There is normal pulmonary artery systolic pressure. 3. Left atrial size was mildly dilated. 4. The mitral valve is normal in structure. No evidence of mitral valve regurgitation. 5. The aortic valve is tricuspid. There is mild calcification of the aortic valve. There is mild thickening of the aortic valve. Aortic valve regurgitation is not visualized.  EKG:   EKG 02/08/2020: Normal sinus rhythm/sinus bradycardia at rate of 59 bpm, normal axis, nonspecific T abnormality in the anterolateral leads.  EKG 02/07/2020: Normal sinus rhythm at a rate of 76 bpm, hyperacute T waves changes in the anterolateral leads in V1 to V4 suggestive of acute STEMI in the anterolateral wall. This is new compared to prior EKG from 2019.  Assessment     ICD-10-CM   1. Coronary artery disease involving native coronary artery of native heart without angina pectoris  I25.10 EKG 12-Lead    carvedilol (COREG) 6.25 MG tablet  2. History of ST elevation myocardial infarction (STEMI)  I25.2 EKG 12-Lead    carvedilol (COREG) 6.25 MG tablet  3. Essential hypertension  I10 carvedilol (COREG) 6.25 MG tablet  4. Mixed hyperlipidemia  E78.2   5. Type 2 diabetes mellitus without complication, with long-term current use of insulin (HCC)  E11.9    Z79.4       Medications Discontinued During This Encounter  Medication Reason  . metoprolol tartrate (LOPRESSOR) 50 MG tablet Side effect (s)    Meds ordered this encounter  Medications  . carvedilol (COREG) 6.25 MG tablet    Sig: Take 1 tablet (6.25 mg total) by mouth 2 (two) times daily.    Dispense:  180 tablet    Refill:  3    Recommendations:   Huxley Shurley is a 64 y.o. male patient with hypertension, hyperlipidemia, diabetes mellitus, with recent ST elevation MI 02/07/2020 with successful PCI and stenting to the proximal and mid LAD.  Patient presents today for follow-up after ST elevation MI with PCI and stenting to the proximal and mid LAD.  He is presently tolerating dual antiplatelet therapy without bleeding diathesis we will continue aspirin and Brilinta.  Since starting metoprolol patient has had increased frequency of bowel movements as well as loose stools and also cough which he attributes to the medication.  We will stop metoprolol and start patient on carvedilol 6.25 mg twice daily.  Is presently tolerating her guideline directed medical therapy, will continue losartan and rosuvastatin as well.    Echocardiogram did reveal mildly reduced left ventricular ejection fraction.  Complication regarding this, and discussed that suspect his heart function will recover well with guideline directed medical therapy.  We will repeat echocardiogram in 3 months.  Counseled patient again regarding healthy diet and lifestyle modifications as well as weight loss.  Patient verbalized understanding and agreed.  Encourage patient to slowly increase physical activity as tolerated and to follow-up with cardiac rehab referral.  Also discussed the importance of diabetes control.  LDL is at goal <70.  Discussed that elevated triglycerides are likely due to uncontrolled  diabetes.  Blood pressure is well controlled.  Follow-up in 4 weeks for coronary artery disease, heart failure, hypertension.  Patient was  seen in collaboration with Dr. Jacinto Halim. He also reviewed patient's chart and examined the patient. Dr. Jacinto Halim is in agreement of the plan.   During this visit I reviewed and updated: Tobacco history  allergies medication reconciliation  medical history  surgical history  family history  social history.  This note was created using a voice recognition software as a result there may be grammatical errors inadvertently enclosed that do not reflect the nature of this encounter. Every attempt is made to correct such errors.  Addendum: EKG 02/15/2020: Sinus rhythm at a rate of 62 bpm, left atrial enlargement.  Normal axis.  Poor R wave progression, cannot exclude anteroseptal infarct old.  Nonspecific T wave abnormality.  Compared to EKG 02/08/2020, no significant change.   Rayford Halsted, PA-C 02/15/2020, 4:38 PM Office: [phone]

## 2020-02-17 NOTE — Telephone Encounter (Signed)
From pt

## 2020-02-22 ENCOUNTER — Ambulatory Visit: Payer: Self-pay | Admitting: Student

## 2020-02-24 ENCOUNTER — Telehealth (HOSPITAL_COMMUNITY): Payer: Self-pay

## 2020-02-24 NOTE — Telephone Encounter (Signed)
Called patient to see if he was interested in participating in the Cardiac Rehab Program. Patient stated yes. Patient will come in for orientation on 03/31/20 @ 8AM and will attend the 9AM exercise class. Went over insurance, patient verbalized understanding.   Pensions consultant.

## 2020-02-24 NOTE — Telephone Encounter (Signed)
From patient.

## 2020-02-29 NOTE — Telephone Encounter (Signed)
From patient.

## 2020-03-03 NOTE — Telephone Encounter (Signed)
From pt

## 2020-03-14 NOTE — Progress Notes (Signed)
Primary Physician/Referring:  [name], MD  Patient ID: Cory Sosa, male    DOB: 1956-02-17, 64 y.o.   MRN: [id number]  Chief Complaint  Patient presents with   Coronary Artery Disease   HPI:    Cory Sosa  is a 64 y.o. male patient with hypertension, hyperlipidemia, diabetes mellitus, with recent ST elevation MI 02/07/2020 with successful PCI and stenting to the proximal and mid LAD.  Patient presents for 1 month follow up CAD, heart failure, and hypertension. At last visit switched patient from metoprolol to carvedilol due to cough and increased frequency of bowel movements. Both cough and bowel movement frequency have improved since switching to metoprolol. Also since last visit patient discontinued losartan 25 mg daily due to symptomatic hypotension. Patient has occasional non-exertional substernal chest discomfort 1/10 severity lasting < 5 minutes occurring a few times per week and relieved spontaneously. He has continued to slowing increase his physical activity, he is presently waling 1.5 miles a few days per week without issue. He is starting cardiac rehab on 03/31/20. Denies shortness of breath, dizziness, fatigue, palpitations, syncope, near-syncope, leg swelling, PND, orthopnea. Patient's home blood pressure readings have been well controlled.    Past Medical History:  Diagnosis Date   Diabetes mellitus without complication (HCC)    Hypertension    controlled with medications   Past Surgical History:  Procedure Laterality Date   CORONARY THROMBECTOMY N/A 02/07/2020   Procedure: Coronary Thrombectomy;  Surgeon: Yates Decamp, MD;  Location: [facility] INVASIVE CV LAB;  Service: Cardiovascular;  Laterality: N/A;   CORONARY/GRAFT ACUTE MI REVASCULARIZATION N/A 02/07/2020   Procedure: Coronary/Graft Acute MI Revascularization;  Surgeon: Yates Decamp, MD;  Location: [facility] INVASIVE CV LAB;  Service: Cardiovascular;  Laterality: N/A;   LEFT HEART CATH AND CORONARY ANGIOGRAPHY N/A  02/07/2020   Procedure: LEFT HEART CATH AND CORONARY ANGIOGRAPHY;  Surgeon: Yates Decamp, MD;  Location: [facility] INVASIVE CV LAB;  Service: Cardiovascular;  Laterality: N/A;   Family History  Problem Relation Age of Onset   Pneumonia Mother     Social History   Tobacco Use   Smoking status: Never Smoker   Smokeless tobacco: Never Used  Substance Use Topics   Alcohol use: Never   Marital Status: Married   ROS  Review of Systems  Constitutional: Negative for malaise/fatigue and weight gain.  Cardiovascular: Positive for chest pain. Negative for claudication, leg swelling, near-syncope, orthopnea, palpitations, paroxysmal nocturnal dyspnea and syncope.  Respiratory: Negative for shortness of breath.   Hematologic/Lymphatic: Does not bruise/bleed easily.  Gastrointestinal: Negative for melena.  Neurological: Negative for dizziness and weakness.    Objective  Blood pressure 137/86, pulse (!) 43, resp. rate 16, height 5\' 10"  (1.778 m), weight 238 lb (108 kg).  Vitals with BMI 03/15/2020 02/15/2020 02/08/2020  Height 5\' 10"  5\' 10"  -  Weight 238 lbs 235 lbs -  BMI 34.15 33.72 -  Systolic 137 121 02/10/2020  Diastolic 86 77 82  Pulse 43 65 -      Physical Exam Vitals reviewed.  HENT:     Head: Normocephalic and atraumatic.  Cardiovascular:     Rate and Rhythm: Normal rate and regular rhythm. Frequent extrasystoles are present.    Pulses: Intact distal pulses.     Heart sounds: S1 normal and S2 normal. No murmur heard. No gallop.      Comments: No JVD, No edema.   Pulmonary:     Effort: Pulmonary effort is normal. No respiratory distress.  Breath sounds: No wheezing, rhonchi or rales.  Musculoskeletal:     Right lower leg: No edema.     Left lower leg: No edema.  Skin:    General: Skin is warm and dry.     Findings: No erythema.  Neurological:     Mental Status: He is alert.     Laboratory examination:   Recent Labs    02/07/20 2018 02/07/20 2050 02/08/20 0612  NA  140 140 139  K 3.5 3.3* 4.9  CL 105 107 104  CO2 24  --  23  GLUCOSE 178* 166* 325*  BUN 14 14 15   CREATININE 0.97 0.80 1.06  CALCIUM 9.5  --  9.1  GFRNONAA >60  --  >60   CrCl cannot be calculated (Patient's most recent lab result is older than the maximum 21 days allowed.).  CMP Latest Ref Rng & Units 02/08/2020 02/07/2020 02/07/2020  Glucose 70 - 99 mg/dL 02/09/2020) 163(A) 453(M)  BUN 8 - 23 mg/dL 15 14 14   Creatinine 0.61 - 1.24 mg/dL 468(E 3.21  Sodium 135 - 145 mmol/L 139 140 140  Potassium 3.5 - 5.1 mmol/L 4.9 3.3(L) 3.5  Chloride 98 - 111 mmol/L 104 107 105  CO2 22 - 32 mmol/L 23 - 24  Calcium 8.9 - 10.3 mg/dL 9.1 - 9.5  Total Protein 6.5 - 8.1 g/dL - - 6.8  Total Bilirubin 0.3 - 1.2 mg/dL - - 0.5  Alkaline Phos 38 - 126 U/L - - 57  AST 15 - 41 U/L - - 45(H)  ALT 0 - 44 U/L - - 72(H)   CBC Latest Ref Rng & Units 02/08/2020 02/07/2020 02/07/2020  WBC 4.0 - 10.5 K/uL 8.0 - 10.3  Hemoglobin 13.0 - 17.0 g/dL 02/09/2020 02/09/2020 00.3  Hematocrit 39.0 - 52.0 % 42.8 40.0 45.0  Platelets 150 - 400 K/uL 275 - 302    Lipid Panel Recent Labs    02/07/20 2018  CHOL 116  TRIG 207*  LDLCALC 33  VLDL 41*  HDL 42  CHOLHDL 2.8    HEMOGLOBIN A1C Lab Results  Component Value Date   HGBA1C 8.1 (H) 02/07/2020   MPG 185.77 02/07/2020   TSH No results for input(s): TSH in the last 8760 hours.  External labs:  None   Medications and allergies   Allergies  Allergen Reactions   Contrast Media [Iodinated Diagnostic Agents] Nausea And Vomiting   Rocephin [Ceftriaxone Sodium In Dextrose] Nausea And Vomiting     Outpatient Medications Prior to Visit  Medication Sig Dispense Refill   aspirin 81 MG chewable tablet Chew 1 tablet (81 mg total) by mouth daily.     carvedilol (COREG) 6.25 MG tablet Take 1 tablet (6.25 mg total) by mouth 2 (two) times daily. 180 tablet 3   Cyanocobalamin (VITAMIN B-12 PO) Take 2 tablets by mouth at bedtime.      ergocalciferol (VITAMIN D2) 1.25  MG (50000 UT) capsule Take 50,000 Units by mouth once a week. Sunday     insulin aspart (NOVOLOG FLEXPEN) 100 UNIT/ML FlexPen Inject 24 Units into the skin 3 (three) times daily with meals. And 10 units with a snack     insulin glargine, 2 Unit Dial, (TOUJEO MAX SOLOSTAR) 300 UNIT/ML Solostar Pen Inject 60 Units into the skin daily.     insulin isophane & regular human (HUMULIN 70/30 MIX) (70-30) 100 UNIT/ML KwikPen Inject 55 Units into the skin 2 (two) times daily.      Multiple Vitamin tablet  Take 1 tablet by mouth daily.     nitroGLYCERIN (NITROSTAT) 0.4 MG SL tablet Place 1 tablet (0.4 mg total) under the tongue every 5 (five) minutes as needed for chest pain. 25 tablet 1   nystatin ointment (MYCOSTATIN) Apply 1 application topically 2 (two) times daily as needed. Balanitis     pantoprazole (PROTONIX) 40 MG tablet Take 40 mg by mouth daily.     rosuvastatin (CRESTOR) 20 MG tablet Take 20 mg by mouth at bedtime.     ticagrelor (BRILINTA) 90 MG TABS tablet Take 1 tablet (90 mg total) by mouth 2 (two) times daily. 60 tablet 3   zolpidem (AMBIEN) 10 MG tablet Take 5 mg by mouth at bedtime as needed for sleep.   2   losartan (COZAAR) 50 MG tablet Take 1 tablet (50 mg total) by mouth every evening. (Patient not taking: No sig reported) 30 tablet 2   metFORMIN (GLUCOPHAGE-XR) 500 MG 24 hr tablet Take 1,000 mg by mouth 2 (two) times daily with a meal.  (Patient not taking: Reported on 03/15/2020)  5   testosterone cypionate (DEPOTESTOTERONE CYPIONATE) 100 MG/ML injection Inject 100 mg into the muscle every 14 (fourteen) days.  (Patient not taking: Reported on 03/15/2020)  2   No facility-administered medications prior to visit.     Radiology:   No results found. DG Chest Portable 1 View 02/07/2020 CLINICAL DATA:  Chest pain, dizziness, nausea EXAM: PORTABLE CHEST 1 VIEW COMPARISON:  03/03/2018 FINDINGS: Single frontal view of the chest demonstrates an unremarkable cardiac silhouette.  No airspace disease, effusion, or pneumothorax. No acute bony abnormality.   Cardiac Studies:   Coronary angiography and angioplasty 02/07/2020: RCA: Dominant, mild disease throughout. Left main: Smooth and normal. Circumflex: Moderate sized, mild diffuse disease. Ramus intermediate: Mild disease. Small. LAD: Large vessel. Occluded after the origin of a large D1. Successful thrombectomy followed by overlapping 4.0 x 38 resolute Onyx and 3.5 x 12 mm Synergy DES deployed at 12 atmospheric pressure each, distal stent was postdilated with the same stent balloon at 16 atmospheric pressure at the overlap. TIMI 0 to TIMI III flow improved, no evidence of edge dissection. 100 mL contrast utilized.  Recommendation: Patient will be observed in the intensive care unit. He will be started on dual antiplatelet therapy and aggressive risk factor modification will be continued.  Echocardiogram 02/08/2020:  1. Left ventricular ejection fraction, by estimation, is 40 to 45%. The left ventricle has mildly decreased function. The left ventricle demonstrates regional wall motion abnormalities (see scoring diagram/findings for description). There is mild left  ventricular hypertrophy. Left ventricular diastolic parameters were normal. There is mild hypokinesis of the left ventricular, mid-apical anterolateral wall and anterior wall. 2. Right ventricular systolic function is normal. The right ventricular size is normal. There is normal pulmonary artery systolic pressure. 3. Left atrial size was mildly dilated. 4. The mitral valve is normal in structure. No evidence of mitral valve regurgitation. 5. The aortic valve is tricuspid. There is mild calcification of the aortic valve. There is mild thickening of the aortic valve. Aortic valve regurgitation is not visualized.  EKG:   EKG 03/15/2020: Sinus rhythm with frequent PVCs in trigemial pattern at a rate of 73 bpm. Left atrial enlargement. Normal  axis.   EKG 02/15/2020: Sinus rhythm at a rate of 62 bpm, left atrial enlargement.  Normal axis.  Poor R wave progression, cannot exclude anteroseptal infarct old.  Nonspecific T wave abnormality.  Compared to EKG 02/08/2020, no significant change.  EKG 02/08/2020: Normal sinus rhythm/sinus bradycardia at rate of 59 bpm, normal axis, nonspecific T abnormality in the anterolateral leads.  EKG 02/07/2020: Normal sinus rhythm at a rate of 76 bpm, hyperacute T waves changes in the anterolateral leads in V1 to V4 suggestive of acute STEMI in the anterolateral wall. This is new compared to prior EKG from 2019.  Assessment     ICD-10-CM   1. Coronary artery disease involving native coronary artery of native heart without angina pectoris  I25.10 EKG 12-Lead  2. Essential hypertension  I10   3. Mixed hyperlipidemia  E78.2   4. Asymptomatic PVCs  I49.3      Medications Discontinued During This Encounter  Medication Reason   testosterone cypionate (DEPOTESTOTERONE CYPIONATE) 100 MG/ML injection Patient Preference    No orders of the defined types were placed in this encounter.   Recommendations:   Cory FantasiaJohn Sosa is a 64 y.o. male patient with hypertension, hyperlipidemia, diabetes mellitus, with recent ST elevation MI 02/07/2020 with successful PCI and stenting to the proximal and mid LAD.  Patient presents for 1 month follow-up of CAD, heart failure, hypertension. Blood pressure is presently well controlled. He is without clinical signs of heart failure. Overall patient is doing well and continues to improve his physical activity. He is to begin cardiac rehab next month. Patient does report occasional brief substernal chest pain. Although pain has atypical features, could be related to underlying known coronary artery disease. We will continue to monitor, counseled patient regarding signs and symptoms that would warrant urgent evaluation in the emergency department, he verbalized understanding and  agreement. Of note patient is also anxious regarding his health since his MI, suspect cardiac rehab will help with his anxiety. Patient is presently on guideline directed medical therapy with the exception of the losartan which he had previously stopped. Will restart losartan 12.5 mg daily, suspect patient will tolerate a lower dose. We will continueAspirin, carvedilol, rosuvastatin, Brilinta, and as needed nitroglycerin.  EKG today did reveal frequent PVCs, which are new. Patient is asymptomatic at this time, recommend continuing to monitor.  We will plan to repeat echocardiogram around February 2022. Encourage patient to continue to follow with PCP regarding management of diabetes.  Follow-up in 6 weeks, sooner if needed, for coronary artery disease, heart failure.   Rayford HalstedCeleste C Brit Carbonell, PA-C 03/15/2020, 1:36 PM Office: [phone]

## 2020-03-15 ENCOUNTER — Encounter: Payer: Self-pay | Admitting: Student

## 2020-03-15 ENCOUNTER — Other Ambulatory Visit: Payer: Self-pay

## 2020-03-15 ENCOUNTER — Ambulatory Visit: Payer: Medicare HMO | Admitting: Student

## 2020-03-15 VITALS — BP 137/86 | HR 43 | Resp 16 | Ht 70.0 in | Wt 238.0 lb

## 2020-03-15 DIAGNOSIS — I10 Essential (primary) hypertension: Secondary | ICD-10-CM

## 2020-03-15 DIAGNOSIS — E782 Mixed hyperlipidemia: Secondary | ICD-10-CM

## 2020-03-15 DIAGNOSIS — I2510 Atherosclerotic heart disease of native coronary artery without angina pectoris: Secondary | ICD-10-CM

## 2020-03-15 DIAGNOSIS — I493 Ventricular premature depolarization: Secondary | ICD-10-CM

## 2020-03-15 MED ORDER — LOSARTAN POTASSIUM 25 MG PO TABS
12.5000 mg | ORAL_TABLET | Freq: Every evening | ORAL | Status: DC
Start: 2020-03-15 — End: 2020-04-26

## 2020-03-23 ENCOUNTER — Telehealth (HOSPITAL_COMMUNITY): Payer: Self-pay

## 2020-03-23 NOTE — Telephone Encounter (Signed)
Cardiac Rehab Medication Review by a Pharmacist  Does the patient  feel that his/her medications are working for him/her?  yes  Has the patient been experiencing any side effects to the medications prescribed?  Yes - He has noticed a slight cough, possibly related to his losartan, but it not bothersome.   Does the patient measure his/her own blood pressure or blood glucose at home?  Yes - He checks both daily and they have generally been at goal.  Does the patient have any problems obtaining medications due to transportation or finances?   no  Understanding of regimen: good Understanding of indications: good Potential of compliance: good    Pharmacist Comments: Not taking metformin; removed from medication list. States he is tolerating his medications and there have been no recent changes.   Trixie Rude, PharmD PGY1 Acute Care Pharmacy Resident 03/23/2020 2:06 PM  Please check [email or web address] for unit-specific pharmacy phone numbers.

## 2020-03-28 NOTE — Telephone Encounter (Signed)
From patient.

## 2020-03-30 ENCOUNTER — Encounter (HOSPITAL_COMMUNITY): Payer: Self-pay | Admitting: *Deleted

## 2020-03-30 NOTE — Progress Notes (Signed)
Called patient and completed health history for Cardiac Rehab and given reminder of his orientation appointment time. We discussed mask, directions to the department, proper shoes, Covid precautions and our department contact number. He voices understanding.

## 2020-03-31 ENCOUNTER — Other Ambulatory Visit: Payer: Self-pay

## 2020-03-31 ENCOUNTER — Encounter (HOSPITAL_COMMUNITY)
Admission: RE | Admit: 2020-03-31 | Discharge: 2020-03-31 | Disposition: A | Discharge status: Home / Self Care | Payer: Medicare Other | Source: Ambulatory Visit | Attending: Cardiology | Admitting: Cardiology

## 2020-03-31 ENCOUNTER — Encounter (HOSPITAL_COMMUNITY): Payer: Self-pay

## 2020-03-31 VITALS — BP 114/72 | Ht 69.0 in | Wt 239.0 lb

## 2020-03-31 DIAGNOSIS — I213 ST elevation (STEMI) myocardial infarction of unspecified site: Secondary | ICD-10-CM | POA: Insufficient documentation

## 2020-03-31 DIAGNOSIS — I2102 ST elevation (STEMI) myocardial infarction involving left anterior descending coronary artery: Secondary | ICD-10-CM

## 2020-03-31 DIAGNOSIS — Z955 Presence of coronary angioplasty implant and graft: Secondary | ICD-10-CM | POA: Insufficient documentation

## 2020-03-31 HISTORY — DX: Atherosclerotic heart disease of native coronary artery without angina pectoris: I25.10

## 2020-03-31 HISTORY — DX: Hyperlipidemia, unspecified: E78.5

## 2020-03-31 NOTE — Progress Notes (Signed)
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  aspirin 81 MG chewable tablet, Chew 1 tablet (81 mg total) by mouth daily., Disp: , Rfl:  .  Canagliflozin-metFORMIN HCl (INVOKAMET PO), Take 1,000 mg by mouth 2 (two) times daily. 50-1000, Disp: , Rfl:  .  carvedilol (COREG) 6.25 MG tablet, Take 1 tablet (6.25 mg total) by mouth 2 (two) times daily., Disp: 180 tablet, Rfl: 3 .  Cyanocobalamin (VITAMIN B-12 PO), Take 2 tablets by mouth at bedtime. , Disp: , Rfl:  .  ergocalciferol (VITAMIN D2) 1.25 MG (50000 UT) capsule, Take 50,000 Units by mouth once a week. Sunday, Disp: , Rfl:  .  insulin aspart (NOVOLOG FLEXPEN) 100 UNIT/ML FlexPen, Inject 24 Units into the skin 3 (three) times daily with meals. And 10 units with a snack, Disp: , Rfl:  .  insulin glargine, 2 Unit Dial, (TOUJEO MAX SOLOSTAR) 300 UNIT/ML Solostar Pen, Inject 60 Units into the skin daily., Disp: , Rfl:  .  losartan (COZAAR) 25 MG tablet, Take 0.5 tablets (12.5 mg total) by mouth every evening., Disp: , Rfl:  .  Multiple Vitamin tablet, Take 1 tablet by mouth daily., Disp: , Rfl:  .  nitroGLYCERIN (NITROSTAT) 0.4 MG SL tablet, Place 1 tablet (0.4 mg total) under the tongue every 5 (five) minutes as needed for chest pain., Disp: 25 tablet, Rfl: 1 .  nystatin ointment (MYCOSTATIN),  Apply 1 application topically 2 (two) times daily as needed. Balanitis, Disp: , Rfl:  .  pantoprazole (PROTONIX) 40 MG tablet, Take 40 mg by mouth daily., Disp: , Rfl:  .  rosuvastatin (CRESTOR) 20 MG tablet, Take 20 mg by mouth at bedtime., Disp: , Rfl:  .  ticagrelor (BRILINTA) 90 MG TABS tablet, Take 1 tablet (90 mg total) by mouth 2 (two) times daily., Disp: 60 tablet, Rfl: 3 .  zolpidem (AMBIEN) 10 MG tablet, Take 5 mg by mouth at bedtime as needed for sleep. , Disp: , Rfl: 2 .  insulin isophane & regular human (HUMULIN 70/30 MIX) (70-30) 100 UNIT/ML KwikPen, Inject 55 Units into the skin 2 (two) times daily.  (Patient not taking: Reported on 03/31/2020), Disp: , Rfl:   Past Medical History: Past Medical History:  Diagnosis Date  . Coronary artery disease   . Diabetes mellitus without complication (HCC)   . Hyperlipidemia   . Hypertension    controlled with medications    Tobacco Use: Social History   Tobacco Use  Smoking Status Never Smoker  Smokeless Tobacco Never Used    Labs: Recent Review Advice worker    Labs for ITP Cardiac and Pulmonary Rehab Latest Ref Rng & Units 02/07/2020   Cholestrol 0 - 200 mg/dL 322   LDLCALC 0 - 99 mg/dL 33   HDL >02 mg/dL 42   Trlycerides <542 mg/dL 706(C)   Hemoglobin B7S 4.8 - 5.6 % 8.1(H)  TCO2 22 - 32 mmol/L 20(L)      Capillary Blood Glucose: No results found for: GLUCAP   Exercise Target Goals: Exercise Program Goal: Individual exercise prescription set using results from initial 6 min walk test and THRR while considering  patient's activity barriers and safety.   Exercise Prescription Goal: Starting with aerobic activity 30 plus minutes a day, 3 days per week for initial exercise prescription. Provide home exercise prescription and guidelines that participant acknowledges understanding prior to discharge.  Activity Barriers & Risk Stratification:  Activity Barriers & Cardiac Risk Stratification - 03/31/20 1059       Activity Barriers & Cardiac Risk Stratification   Activity Barriers Back Problems;Arthritis;Left Knee Replacement;Neck/Spine Problems;Chest Pain/Angina;Decreased Ventricular Function    Cardiac Risk Stratification High           6 Minute Walk:  6 Minute Walk    Row Name 03/31/20 0833         6 Minute Walk   Phase Initial     Distance 1649 feet     Walk Time 6 minutes     # of Rest Breaks 0     MPH 3.12     METS 3.24     RPE 6     Perceived Dyspnea  0     VO2 Peak 11.35     Symptoms No     Resting HR 68 bpm     Resting BP 114/72     Resting Oxygen Saturation  96 %     Exercise Oxygen Saturation  during 6 min walk 96 %     Max Ex. HR 88 bpm     Max Ex. BP 126/76     2 Minute Post BP 110/70            Oxygen Initial Assessment:   Oxygen Re-Evaluation:   Oxygen Discharge (Final Oxygen Re-Evaluation):   Initial Exercise Prescription:  Initial Exercise Prescription - 03/31/20 1100      Date of Initial Exercise RX and Referring Provider   Date 03/31/20    Referring Provider Yates DecampJay Ganji, MD    Expected Discharge Date 05/27/20      NuStep   Level 3    SPM 85    Minutes 15    METs 2.4      Track   Laps 15    Minutes 15    METs 2.74      Prescription Details   Frequency (times per week) 3    Duration Progress to 30 minutes of continuous aerobic without signs/symptoms of physical distress      Intensity   THRR 40-80% of Max Heartrate 62-125    Ratings of Perceived Exertion 11-13    Perceived Dyspnea 0-4      Progression   Progression Continue progressive overload as per policy without signs/symptoms or physical distress.      Resistance Training   Training Prescription Yes    Weight 5 lbs    Reps 10-15           Perform Capillary Blood Glucose checks as needed.  Exercise Prescription Changes:   Exercise Comments:   Exercise Goals and Review:  Exercise Goals    Row Name 03/31/20 1108             Exercise Goals   Increase Physical  Activity Yes       Intervention Provide advice, education, support and counseling about physical activity/exercise needs.;Develop an individualized exercise prescription for aerobic and resistive  training based on initial evaluation findings, risk stratification, comorbidities and participant's personal goals.       Expected Outcomes Short Term: Attend rehab on a regular basis to increase amount of physical activity.;Long Term: Add in home exercise to make exercise part of routine and to increase amount of physical activity.;Long Term: Exercising regularly at least 3-5 days a week.       Increase Strength and Stamina Yes       Intervention Provide advice, education, support and counseling about physical activity/exercise needs.;Develop an individualized exercise prescription for aerobic and resistive training based on initial evaluation findings, risk stratification, comorbidities and participant's personal goals.       Expected Outcomes Short Term: Increase workloads from initial exercise prescription for resistance, speed, and METs.;Short Term: Perform resistance training exercises routinely during rehab and add in resistance training at home;Long Term: Improve cardiorespiratory fitness, muscular endurance and strength as measured by increased METs and functional capacity ( )       Able to understand and use rate of perceived exertion (RPE) scale Yes       Intervention Provide education and explanation on how to use RPE scale       Expected Outcomes Short Term: Able to use RPE daily in rehab to express subjective intensity level;Long Term:  Able to use RPE to guide intensity level when exercising independently       Knowledge and understanding of Target Heart Rate Range (THRR) Yes       Intervention Provide education and explanation of THRR including how the numbers were predicted and where they are located for reference       Expected Outcomes Short Term: Able to state/look up THRR;Short Term: Able  to use daily as guideline for intensity in rehab;Long Term: Able to use THRR to govern intensity when exercising independently       Understanding of Exercise Prescription Yes       Intervention Provide education, explanation, and written materials on patient's individual exercise prescription       Expected Outcomes Short Term: Able to explain program exercise prescription;Long Term: Able to explain home exercise prescription to exercise independently              Exercise Goals Re-Evaluation :    Discharge Exercise Prescription (Final Exercise Prescription Changes):   Nutrition:  Target Goals: Understanding of nutrition guidelines, daily intake of sodium 1500mg , cholesterol 200mg , calories 30% from fat and 7% or less from saturated fats, daily to have 5 or more servings of fruits and vegetables.  Biometrics:   Post Biometrics - 03/31/20 0800       Post  Biometrics   Waist Circumference 48 inches    Hip Circumference 47.5 inches    Waist to Hip Ratio 1.01 %    Triceps Skinfold 12 mm    % Body Fat 33 %    Grip Strength 46 kg    Flexibility 11 in    Single Leg Stand 16.25 seconds           Nutrition Therapy Plan and Nutrition Goals:   Nutrition Assessments:  MEDIFICTS Score Key:  ?70 Need to make dietary changes   40-70 Heart Healthy Diet  ? 40 Therapeutic Level Cholesterol Diet   Picture Your Plate Scores:  <16 Unhealthy dietary pattern with much room for improvement.  41-50 Dietary pattern unlikely to meet recommendations for good health and room for improvement.  51-60 More healthful dietary pattern, with some room for improvement.   >60  Healthy dietary pattern, although there may be some specific behaviors that could be improved.    Nutrition Goals Re-Evaluation:   Nutrition Goals Discharge (Final Nutrition Goals Re-Evaluation):   Psychosocial: Target Goals: Acknowledge presence or absence of significant depression and/or stress, maximize  coping skills, provide positive support system. Participant is able to verbalize types and ability to use techniques and skills needed for reducing stress and depression.  Initial Review & Psychosocial Screening:  Initial Psych Review & Screening - 03/31/20 1146      Initial Review   Current issues with None Identified      Family Dynamics   Good Support System? Yes   Jael has his wife for support     Barriers   Psychosocial barriers to participate in program There are no identifiable barriers or psychosocial needs.      Screening Interventions   Interventions Encouraged to exercise           Quality of Life Scores:  Quality of Life - 03/31/20 1050      Quality of Life   Select Quality of Life      Quality of Life Scores   Health/Function Pre 19.57 %    Socioeconomic Pre 30 %    Psych/Spiritual Pre 20.21 %    Family Pre 24.8 %    GLOBAL Pre 22.83 %          Scores of 19 and below usually indicate a poorer quality of life in these areas.  A difference of  2-3 points is a clinically meaningful difference.  A difference of 2-3 points in the total score of the Quality of Life Index has been associated with significant improvement in overall quality of life, self-image, physical symptoms, and general health in studies assessing change in quality of life.  PHQ-9: Recent Review Flowsheet Data    Depression screen [facility] 2/9 03/31/2020   Decreased Interest 0   Down, Depressed, Hopeless 0   PHQ - 2 Score 0   Altered sleeping 0   Tired, decreased energy 1   Change in appetite 0   Feeling bad or failure about yourself  0   Trouble concentrating 0   Moving slowly or fidgety/restless 0   Suicidal thoughts 0   PHQ-9 Score 1   Difficult doing work/chores Not difficult at all     Interpretation of Total Score  Total Score Depression Severity:  1-4 = Minimal depression, 5-9 = Mild depression, 10-14 = Moderate depression, 15-19 = Moderately severe depression, 20-27 = Severe  depression   Psychosocial Evaluation and Intervention:   Psychosocial Re-Evaluation:   Psychosocial Discharge (Final Psychosocial Re-Evaluation):   Vocational Rehabilitation: Provide vocational rehab assistance to qualifying candidates.   Vocational Rehab Evaluation & Intervention:  Vocational Rehab - 03/31/20 1147      Initial Vocational Rehab Evaluation & Intervention   Assessment shows need for Vocational Rehabilitation No           Education: Education Goals: Education classes will be provided on a weekly basis, covering required topics. Participant will state understanding/return demonstration of topics presented.  Learning Barriers/Preferences:  Learning Barriers/Preferences - 03/31/20 1051      Learning Barriers/Preferences   Learning Barriers None    Learning Preferences Audio;Computer/Internet;Pictoral;Video;Verbal Instruction           Education Topics: Hypertension, Hypertension Reduction -Define heart disease and high blood pressure. Discus how high blood pressure affects the body and ways to reduce high blood pressure.   Exercise and Your Heart -  Discuss why it is important to exercise, the FITT principles of exercise, normal and abnormal responses to exercise, and how to exercise safely.   Angina -Discuss definition of angina, causes of angina, treatment of angina, and how to decrease risk of having angina.   Cardiac Medications -Review what the following cardiac medications are used for, how they affect the body, and side effects that may occur when taking the medications.  Medications include Aspirin, Beta blockers, calcium channel blockers, ACE Inhibitors, angiotensin receptor blockers, diuretics, digoxin, and antihyperlipidemics.   Congestive Heart Failure -Discuss the definition of CHF, how to live with CHF, the signs and symptoms of CHF, and how keep track of weight and sodium intake.   Heart Disease and Intimacy -Discus the effect sexual  activity has on the heart, how changes occur during intimacy as we age, and safety during sexual activity.   Smoking Cessation / COPD -Discuss different methods to quit smoking, the health benefits of quitting smoking, and the definition of COPD.   Nutrition I: Fats -Discuss the types of cholesterol, what cholesterol does to the heart, and how cholesterol levels can be controlled.   Nutrition II: Labels -Discuss the different components of food labels and how to read food label   Heart Parts/Heart Disease and PAD -Discuss the anatomy of the heart, the pathway of blood circulation through the heart, and these are affected by heart disease.   Stress I: Signs and Symptoms -Discuss the causes of stress, how stress may lead to anxiety and depression, and ways to limit stress.   Stress II: Relaxation -Discuss different types of relaxation techniques to limit stress.   Warning Signs of Stroke / TIA -Discuss definition of a stroke, what the signs and symptoms are of a stroke, and how to identify when someone is having stroke.   Knowledge Questionnaire Score:  Knowledge Questionnaire Score - 03/31/20 1052      Knowledge Questionnaire Score   Pre Score 19/24           Core Components/Risk Factors/Patient Goals at Admission:  Personal Goals and Risk Factors at Admission - 03/31/20 1055      Core Components/Risk Factors/Patient Goals on Admission    Weight Management Yes;Obesity;Weight Loss    Intervention Weight Management: Develop a combined nutrition and exercise program designed to reach desired caloric intake, while maintaining appropriate intake of nutrient and fiber, sodium and fats, and appropriate energy expenditure required for the weight goal.;Weight Management: Provide education and appropriate resources to help participant work on and attain dietary goals.;Weight Management/Obesity: Establish reasonable short term and long term weight goals.;Obesity: Provide education  and appropriate resources to help participant work on and attain dietary goals.    Admit Weight 238 lb 15.7 oz (108.4 kg)    Expected Outcomes Short Term: Continue to assess and modify interventions until short term weight is achieved;Long Term: Adherence to nutrition and physical activity/exercise program aimed toward attainment of established weight goal;Weight Maintenance: Understanding of the daily nutrition guidelines, which includes 25-35% calories from fat, 7% or less cal from saturated fats, less than 200mg  cholesterol, less than 1.5gm of sodium, & 5 or more servings of fruits and vegetables daily;Weight Loss: Understanding of general recommendations for a balanced deficit meal plan, which promotes 1-2 lb weight loss per week and includes a negative energy balance of [phone] kcal/d;Understanding recommendations for meals to include 15-35% energy as protein, 25-35% energy from fat, 35-60% energy from carbohydrates, less than 200mg  of dietary cholesterol, 20-35 gm of total fiber  daily;Understanding of distribution of calorie intake throughout the day with the consumption of 4-5 meals/snacks    Diabetes Yes    Intervention Provide education about signs/symptoms and action to take for hypo/hyperglycemia.;Provide education about proper nutrition, including hydration, and aerobic/resistive exercise prescription along with prescribed medications to achieve blood glucose in normal ranges: Fasting glucose 65-99 mg/dL    Expected Outcomes Short Term: Participant verbalizes understanding of the signs/symptoms and immediate care of hyper/hypoglycemia, proper foot care and importance of medication, aerobic/resistive exercise and nutrition plan for blood glucose control.;Long Term: Attainment of HbA1C < 7%.    Hypertension Yes    Intervention Provide education on lifestyle modifcations including regular physical activity/exercise, weight management, moderate sodium restriction and increased consumption of fresh  fruit, vegetables, and low fat dairy, alcohol moderation, and smoking cessation.;Monitor prescription use compliance.    Expected Outcomes Short Term: Continued assessment and intervention until BP is < 140/29mm HG in hypertensive participants. < 130/15mm HG in hypertensive participants with diabetes, heart failure or chronic kidney disease.;Long Term: Maintenance of blood pressure at goal levels.    Lipids Yes    Intervention Provide education and support for participant on nutrition & aerobic/resistive exercise along with prescribed medications to achieve LDL 70mg , HDL >40mg .    Expected Outcomes Short Term: Participant states understanding of desired cholesterol values and is compliant with medications prescribed. Participant is following exercise prescription and nutrition guidelines.;Long Term: Cholesterol controlled with medications as prescribed, with individualized exercise RX and with personalized nutrition plan. Value goals: LDL < 70mg , HDL > 40 mg.           Core Components/Risk Factors/Patient Goals Review:    Core Components/Risk Factors/Patient Goals at Discharge (Final Review):    ITP Comments:  ITP Comments    Row Name 03/31/20 0832           ITP Comments Dr 05/29/20 MD, Medical Director              Comments: Armanda Magic attended orientation on 03/31/2020 to review rules and guidelines for program.  Completed 6 minute walk test, Intitial ITP, and exercise prescription.  VSS. Telemetry-Sinus rhythm.  Asymptomatic. Safety measures and social distancing in place per CDC guidelines.05/29/2020, RN,BSN 03/31/2020 11:52 AM

## 2020-04-04 ENCOUNTER — Encounter (HOSPITAL_COMMUNITY)
Admission: RE | Admit: 2020-04-04 | Discharge: 2020-04-04 | Disposition: A | Discharge status: Home / Self Care | Payer: Medicare Other | Source: Ambulatory Visit | Attending: Cardiology | Admitting: Cardiology

## 2020-04-04 ENCOUNTER — Other Ambulatory Visit: Payer: Self-pay

## 2020-04-04 DIAGNOSIS — I213 ST elevation (STEMI) myocardial infarction of unspecified site: Secondary | ICD-10-CM | POA: Diagnosis not present

## 2020-04-04 DIAGNOSIS — Z955 Presence of coronary angioplasty implant and graft: Secondary | ICD-10-CM

## 2020-04-04 DIAGNOSIS — I2102 ST elevation (STEMI) myocardial infarction involving left anterior descending coronary artery: Secondary | ICD-10-CM

## 2020-04-04 LAB — GLUCOSE, CAPILLARY
Glucose-Capillary: 234 mg/dL — ABNORMAL HIGH (ref 70–99)
Glucose-Capillary: 185 mg/dL — ABNORMAL HIGH (ref 70–99)

## 2020-04-05 NOTE — Progress Notes (Signed)
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  aspirin 81 MG chewable tablet, Chew 1 tablet (81 mg total) by mouth daily., Disp: , Rfl:  .  Canagliflozin-metFORMIN HCl (INVOKAMET PO), Take 1,000 mg by mouth 2 (two) times daily. 50-1000, Disp: , Rfl:  .  carvedilol (COREG) 6.25 MG tablet, Take 1 tablet (6.25 mg total) by mouth 2 (two) times daily., Disp: 180 tablet, Rfl: 3 .  Cyanocobalamin (VITAMIN B-12 PO), Take 2 tablets by mouth at bedtime. , Disp: , Rfl:  .  ergocalciferol (VITAMIN D2) 1.25 MG (50000 UT) capsule, Take 50,000 Units by mouth once a week. Sunday, Disp: , Rfl:  .  insulin aspart (NOVOLOG FLEXPEN) 100 UNIT/ML FlexPen, Inject 24 Units into the skin 3 (three) times daily with meals. And 10 units with a snack, Disp: , Rfl:  .  insulin glargine, 2 Unit Dial, (TOUJEO MAX SOLOSTAR) 300 UNIT/ML Solostar Pen, Inject 60 Units into the skin daily., Disp: , Rfl:  .  insulin isophane & regular human (HUMULIN 70/30 MIX) (70-30) 100 UNIT/ML KwikPen, Inject 55 Units into the skin 2 (two) times daily.  (Patient not taking: Reported on 03/31/2020), Disp: , Rfl:  .  losartan (COZAAR) 25 MG tablet, Take 0.5 tablets (12.5 mg total) by mouth every evening., Disp: , Rfl:  .  Multiple Vitamin tablet, Take 1 tablet by mouth daily., Disp: , Rfl:  .   nitroGLYCERIN (NITROSTAT) 0.4 MG SL tablet, Place 1 tablet (0.4 mg total) under the tongue every 5 (five) minutes as needed for chest pain., Disp: 25 tablet, Rfl: 1 .  nystatin ointment (MYCOSTATIN), Apply 1 application topically 2 (two) times daily as needed. Balanitis, Disp: , Rfl:  .  pantoprazole (PROTONIX) 40 MG tablet, Take 40 mg by mouth daily., Disp: , Rfl:  .  rosuvastatin (CRESTOR) 20 MG tablet, Take 20 mg by mouth at bedtime., Disp: , Rfl:  .  ticagrelor (BRILINTA) 90 MG TABS tablet, Take 1 tablet (90 mg total) by mouth 2 (two) times daily., Disp: 60 tablet, Rfl: 3 .  zolpidem (AMBIEN) 10 MG tablet, Take 5 mg by mouth at bedtime as needed for sleep. , Disp: , Rfl: 2  Past Medical History: Past Medical History:  Diagnosis Date  . Coronary artery disease   . Diabetes mellitus without complication (HCC)   . Hyperlipidemia   . Hypertension    controlled with medications    Tobacco Use: Social History   Tobacco Use  Smoking Status Never Smoker  Smokeless Tobacco Never Used    Labs: Recent Review Advice worker    Labs for ITP Cardiac and Pulmonary Rehab Latest Ref Rng & Units 02/07/2020   Cholestrol 0 - 200 mg/dL 644   LDLCALC 0 - 99 mg/dL 33   HDL >03 mg/dL 42   Trlycerides <474 mg/dL 259(D)   Hemoglobin G3O 4.8 - 5.6 % 8.1(H)  TCO2 22 - 32 mmol/L 20(L)      Capillary Blood Glucose: Lab Results  Component Value Date   GLUCAP 185 (H) 04/04/2020   GLUCAP 234 (H) 04/04/2020     Exercise Target Goals: Exercise Program Goal: Individual exercise prescription set using results from initial 6 min walk test and THRR while considering  patient's activity barriers and safety.   Exercise Prescription Goal: Starting with aerobic activity 30 plus minutes a day, 3 days per week for initial exercise prescription. Provide home exercise prescription and guidelines that participant acknowledges understanding prior to discharge.  Activity Barriers & Risk Stratification:   Activity Barriers & Cardiac Risk Stratification - 03/31/20 1059      Activity Barriers & Cardiac Risk Stratification   Activity Barriers Back Problems;Arthritis;Left Knee Replacement;Neck/Spine Problems;Chest Pain/Angina;Decreased Ventricular Function    Cardiac Risk Stratification High           6 Minute Walk:  6 Minute Walk    Row Name 03/31/20 0833         6 Minute Walk   Phase Initial     Distance 1649 feet     Walk Time 6 minutes     # of Rest Breaks 0     MPH 3.12     METS 3.24     RPE 6     Perceived Dyspnea  0     VO2 Peak 11.35     Symptoms No     Resting HR 68 bpm     Resting BP 114/72     Resting Oxygen Saturation  96 %     Exercise Oxygen Saturation  during 6 min walk 96 %     Max Ex. HR 88 bpm     Max Ex. BP 126/76     2 Minute Post BP 110/70            Oxygen Initial Assessment:   Oxygen Re-Evaluation:   Oxygen Discharge (Final Oxygen Re-Evaluation):   Initial Exercise Prescription:  Initial Exercise Prescription - 03/31/20 1100      Date of Initial Exercise RX and Referring Provider   Date 03/31/20    Referring Provider Yates Decamp, MD    Expected Discharge Date 05/27/20      NuStep   Level 3    SPM 85    Minutes 15    METs 2.4      Track   Laps 15    Minutes 15    METs 2.74      Prescription Details   Frequency (times per week) 3    Duration Progress to 30 minutes of continuous aerobic without signs/symptoms of physical distress      Intensity   THRR 40-80% of Max Heartrate 62-125    Ratings of Perceived Exertion 11-13    Perceived Dyspnea 0-4      Progression   Progression Continue progressive overload as per policy without signs/symptoms or physical distress.      Resistance Training   Training Prescription Yes    Weight 5 lbs    Reps 10-15           Perform Capillary Blood Glucose checks as needed.  Exercise Prescription Changes:  Exercise Prescription Changes    Row Name 04/04/20 1000              Response to Exercise   Blood Pressure (Admit) 96/58       Blood Pressure (Exercise) 124/82       Blood Pressure (  Exit) 102/60       Heart Rate (Admit) 68 bpm       Heart Rate (Exercise) 94 bpm       Heart Rate (Exit) 78 bpm       Rating of Perceived Exertion (Exercise) 12       Symptoms None       Comments Pt's first day of exercise in the CRP2 program.       Duration Progress to 30 minutes of  aerobic without signs/symptoms of physical distress       Intensity THRR unchanged               Progression   Progression Continue to progress workloads to maintain intensity without signs/symptoms of physical distress.       Average METs 2.5               Resistance Training   Training Prescription Yes       Weight 5 lbs       Reps 10-15               Interval Training   Interval Training No               NuStep   Level 3       SPM 85       Minutes 15       METs 2.2               Track   Laps 15       Minutes 16       METs 2.86              Exercise Comments:  Exercise Comments    Row Name 04/04/20 1038           Exercise Comments Pt's first day of exercise in the CRP2 program. Tolerated session well with no complaints.              Exercise Goals and Review:  Exercise Goals    Row Name 03/31/20 1108             Exercise Goals   Increase Physical Activity Yes       Intervention Provide advice, education, support and counseling about physical activity/exercise needs.;Develop an individualized exercise prescription for aerobic and resistive training based on initial evaluation findings, risk stratification, comorbidities and participant's personal goals.       Expected Outcomes Short Term: Attend rehab on a regular basis to increase amount of physical activity.;Long Term: Add in home exercise to make exercise part of routine and to increase amount of physical activity.;Long Term: Exercising regularly at least 3-5 days a week.       Increase Strength and  Stamina Yes       Intervention Provide advice, education, support and counseling about physical activity/exercise needs.;Develop an individualized exercise prescription for aerobic and resistive training based on initial evaluation findings, risk stratification, comorbidities and participant's personal goals.       Expected Outcomes Short Term: Increase workloads from initial exercise prescription for resistance, speed, and METs.;Short Term: Perform resistance training exercises routinely during rehab and add in resistance training at home;Long Term: Improve cardiorespiratory fitness, muscular endurance and strength as measured by increased METs and functional capacity ( )       Able to understand and use rate of perceived exertion (RPE) scale Yes       Intervention Provide education and explanation on how to use RPE scale  Expected Outcomes Short Term: Able to use RPE daily in rehab to express subjective intensity level;Long Term:  Able to use RPE to guide intensity level when exercising independently       Knowledge and understanding of Target Heart Rate Range (THRR) Yes       Intervention Provide education and explanation of THRR including how the numbers were predicted and where they are located for reference       Expected Outcomes Short Term: Able to state/look up THRR;Short Term: Able to use daily as guideline for intensity in rehab;Long Term: Able to use THRR to govern intensity when exercising independently       Understanding of Exercise Prescription Yes       Intervention Provide education, explanation, and written materials on patient's individual exercise prescription       Expected Outcomes Short Term: Able to explain program exercise prescription;Long Term: Able to explain home exercise prescription to exercise independently              Exercise Goals Re-Evaluation :  Exercise Goals Re-Evaluation    Row Name 04/04/20 1037             Exercise Goal Re-Evaluation    Exercise Goals Review Increase Physical Activity;Increase Strength and Stamina;Able to understand and use rate of perceived exertion (RPE) scale;Knowledge and understanding of Target Heart Rate Range (THRR);Understanding of Exercise Prescription       Comments Pt's first day of exercise in the CRP2 program. Pt understands the RPE scale, THRR, and Exercise Rx.       Expected Outcomes Will continue to monitor patient and progress exercise workloads as tolerated.               Discharge Exercise Prescription (Final Exercise Prescription Changes):  Exercise Prescription Changes - 04/04/20 1000      Response to Exercise   Blood Pressure (Admit) 96/58    Blood Pressure (Exercise) 124/82    Blood Pressure (Exit) 102/60    Heart Rate (Admit) 68 bpm    Heart Rate (Exercise) 94 bpm    Heart Rate (Exit) 78 bpm    Rating of Perceived Exertion (Exercise) 12    Symptoms None    Comments Pt's first day of exercise in the CRP2 program.    Duration Progress to 30 minutes of  aerobic without signs/symptoms of physical distress    Intensity THRR unchanged      Progression   Progression Continue to progress workloads to maintain intensity without signs/symptoms of physical distress.    Average METs 2.5      Resistance Training   Training Prescription Yes    Weight 5 lbs    Reps 10-15      Interval Training   Interval Training No      NuStep   Level 3    SPM 85    Minutes 15    METs 2.2      Track   Laps 15    Minutes 16    METs 2.86           Nutrition:  Target Goals: Understanding of nutrition guidelines, daily intake of sodium 1500mg , cholesterol 200mg , calories 30% from fat and 7% or less from saturated fats, daily to have 5 or more servings of fruits and vegetables.  Biometrics:   Post Biometrics - 03/31/20 0800       Post  Biometrics   Waist Circumference 48 inches    Hip Circumference 47.5 inches  Waist to Hip Ratio 1.01 %    Triceps Skinfold 12 mm    % Body Fat  33 %    Grip Strength 46 kg    Flexibility 11 in    Single Leg Stand 16.25 seconds           Nutrition Therapy Plan and Nutrition Goals:  Nutrition Therapy & Goals - 04/04/20 1414      Nutrition Therapy   RD appointment deferred Yes           Nutrition Assessments:  MEDIFICTS Score Key:  ?70 Need to make dietary changes   40-70 Heart Healthy Diet  ? 40 Therapeutic Level Cholesterol Diet   Picture Your Plate Scores:  <81 Unhealthy dietary pattern with much room for improvement.  41-50 Dietary pattern unlikely to meet recommendations for good health and room for improvement.  51-60 More healthful dietary pattern, with some room for improvement.   >60 Healthy dietary pattern, although there may be some specific behaviors that could be improved.    Nutrition Goals Re-Evaluation:   Nutrition Goals Discharge (Final Nutrition Goals Re-Evaluation):   Psychosocial: Target Goals: Acknowledge presence or absence of significant depression and/or stress, maximize coping skills, provide positive support system. Participant is able to verbalize types and ability to use techniques and skills needed for reducing stress and depression.  Initial Review & Psychosocial Screening:  Initial Psych Review & Screening - 03/31/20 1146      Initial Review   Current issues with None Identified      Family Dynamics   Good Support System? Yes   Cory Sosa has his wife for support     Barriers   Psychosocial barriers to participate in program There are no identifiable barriers or psychosocial needs.      Screening Interventions   Interventions Encouraged to exercise           Quality of Life Scores:  Quality of Life - 03/31/20 1050      Quality of Life   Select Quality of Life      Quality of Life Scores   Health/Function Pre 19.57 %    Socioeconomic Pre 30 %    Psych/Spiritual Pre 20.21 %    Family Pre 24.8 %    GLOBAL Pre 22.83 %          Scores of 19 and below  usually indicate a poorer quality of life in these areas.  A difference of  2-3 points is a clinically meaningful difference.  A difference of 2-3 points in the total score of the Quality of Life Index has been associated with significant improvement in overall quality of life, self-image, physical symptoms, and general health in studies assessing change in quality of life.  PHQ-9: Recent Review Flowsheet Data    Depression screen [facility] 2/9 03/31/2020   Decreased Interest 0   Down, Depressed, Hopeless 0   PHQ - 2 Score 0   Altered sleeping 0   Tired, decreased energy 1   Change in appetite 0   Feeling bad or failure about yourself  0   Trouble concentrating 0   Moving slowly or fidgety/restless 0   Suicidal thoughts 0   PHQ-9 Score 1   Difficult doing work/chores Not difficult at all     Interpretation of Total Score  Total Score Depression Severity:  1-4 = Minimal depression, 5-9 = Mild depression, 10-14 = Moderate depression, 15-19 = Moderately severe depression, 20-27 = Severe depression   Psychosocial Evaluation and  Intervention:   Psychosocial Re-Evaluation:  Psychosocial Re-Evaluation    Row Name 04/05/20 [phone]             Psychosocial Re-Evaluation   Current issues with Current Stress Concerns       Comments To Review quality of life questionairre the week of 04/04/20       Interventions Encouraged to attend Cardiac Rehabilitation for the exercise;Encouraged to attend Pulmonary Rehabilitation for the exercise       Continue Psychosocial Services  Follow up required by staff               Initial Review   Source of Stress Concerns Family              Psychosocial Discharge (Final Psychosocial Re-Evaluation):  Psychosocial Re-Evaluation - 04/05/20 0933      Psychosocial Re-Evaluation   Current issues with Current Stress Concerns    Comments To Review quality of life questionairre the week of 04/04/20    Interventions Encouraged to attend Cardiac Rehabilitation for  the exercise;Encouraged to attend Pulmonary Rehabilitation for the exercise    Continue Psychosocial Services  Follow up required by staff      Initial Review   Source of Stress Concerns Family           Vocational Rehabilitation: Provide vocational rehab assistance to qualifying candidates.   Vocational Rehab Evaluation & Intervention:  Vocational Rehab - 03/31/20 1147      Initial Vocational Rehab Evaluation & Intervention   Assessment shows need for Vocational Rehabilitation No           Education: Education Goals: Education classes will be provided on a weekly basis, covering required topics. Participant will state understanding/return demonstration of topics presented.  Learning Barriers/Preferences:  Learning Barriers/Preferences - 03/31/20 1051      Learning Barriers/Preferences   Learning Barriers None    Learning Preferences Audio;Computer/Internet;Pictoral;Video;Verbal Instruction           Education Topics: Hypertension, Hypertension Reduction -Define heart disease and high blood pressure. Discus how high blood pressure affects the body and ways to reduce high blood pressure.   Exercise and Your Heart -Discuss why it is important to exercise, the FITT principles of exercise, normal and abnormal responses to exercise, and how to exercise safely.   Angina -Discuss definition of angina, causes of angina, treatment of angina, and how to decrease risk of having angina.   Cardiac Medications -Review what the following cardiac medications are used for, how they affect the body, and side effects that may occur when taking the medications.  Medications include Aspirin, Beta blockers, calcium channel blockers, ACE Inhibitors, angiotensin receptor blockers, diuretics, digoxin, and antihyperlipidemics.   Congestive Heart Failure -Discuss the definition of CHF, how to live with CHF, the signs and symptoms of CHF, and how keep track of weight and sodium  intake.   Heart Disease and Intimacy -Discus the effect sexual activity has on the heart, how changes occur during intimacy as we age, and safety during sexual activity.   Smoking Cessation / COPD -Discuss different methods to quit smoking, the health benefits of quitting smoking, and the definition of COPD.   Nutrition I: Fats -Discuss the types of cholesterol, what cholesterol does to the heart, and how cholesterol levels can be controlled.   Nutrition II: Labels -Discuss the different components of food labels and how to read food label   Heart Parts/Heart Disease and PAD -Discuss the anatomy of the heart, the pathway of blood circulation  through the heart, and these are affected by heart disease.   Stress I: Signs and Symptoms -Discuss the causes of stress, how stress may lead to anxiety and depression, and ways to limit stress.   Stress II: Relaxation -Discuss different types of relaxation techniques to limit stress.   Warning Signs of Stroke / TIA -Discuss definition of a stroke, what the signs and symptoms are of a stroke, and how to identify when someone is having stroke.   Knowledge Questionnaire Score:  Knowledge Questionnaire Score - 03/31/20 1052      Knowledge Questionnaire Score   Pre Score 19/24           Core Components/Risk Factors/Patient Goals at Admission:  Personal Goals and Risk Factors at Admission - 03/31/20 1055      Core Components/Risk Factors/Patient Goals on Admission    Weight Management Yes;Obesity;Weight Loss    Intervention Weight Management: Develop a combined nutrition and exercise program designed to reach desired caloric intake, while maintaining appropriate intake of nutrient and fiber, sodium and fats, and appropriate energy expenditure required for the weight goal.;Weight Management: Provide education and appropriate resources to help participant work on and attain dietary goals.;Weight Management/Obesity: Establish reasonable  short term and long term weight goals.;Obesity: Provide education and appropriate resources to help participant work on and attain dietary goals.    Admit Weight 238 lb 15.7 oz (108.4 kg)    Expected Outcomes Short Term: Continue to assess and modify interventions until short term weight is achieved;Long Term: Adherence to nutrition and physical activity/exercise program aimed toward attainment of established weight goal;Weight Maintenance: Understanding of the daily nutrition guidelines, which includes 25-35% calories from fat, 7% or less cal from saturated fats, less than 200mg  cholesterol, less than 1.5gm of sodium, & 5 or more servings of fruits and vegetables daily;Weight Loss: Understanding of general recommendations for a balanced deficit meal plan, which promotes 1-2 lb weight loss per week and includes a negative energy balance of [phone] kcal/d;Understanding recommendations for meals to include 15-35% energy as protein, 25-35% energy from fat, 35-60% energy from carbohydrates, less than 200mg  of dietary cholesterol, 20-35 gm of total fiber daily;Understanding of distribution of calorie intake throughout the day with the consumption of 4-5 meals/snacks    Diabetes Yes    Intervention Provide education about signs/symptoms and action to take for hypo/hyperglycemia.;Provide education about proper nutrition, including hydration, and aerobic/resistive exercise prescription along with prescribed medications to achieve blood glucose in normal ranges: Fasting glucose 65-99 mg/dL    Expected Outcomes Short Term: Participant verbalizes understanding of the signs/symptoms and immediate care of hyper/hypoglycemia, proper foot care and importance of medication, aerobic/resistive exercise and nutrition plan for blood glucose control.;Long Term: Attainment of HbA1C < 7%.    Hypertension Yes    Intervention Provide education on lifestyle modifcations including regular physical activity/exercise, weight management,  moderate sodium restriction and increased consumption of fresh fruit, vegetables, and low fat dairy, alcohol moderation, and smoking cessation.;Monitor prescription use compliance.    Expected Outcomes Short Term: Continued assessment and intervention until BP is < 140/6590mm HG in hypertensive participants. < 130/3180mm HG in hypertensive participants with diabetes, heart failure or chronic kidney disease.;Long Term: Maintenance of blood pressure at goal levels.    Lipids Yes    Intervention Provide education and support for participant on nutrition & aerobic/resistive exercise along with prescribed medications to achieve LDL 70mg , HDL >40mg .    Expected Outcomes Short Term: Participant states understanding of desired cholesterol values and is compliant  with medications prescribed. Participant is following exercise prescription and nutrition guidelines.;Long Term: Cholesterol controlled with medications as prescribed, with individualized exercise RX and with personalized nutrition plan. Value goals: LDL < 70mg , HDL > 40 mg.           Core Components/Risk Factors/Patient Goals Review:   Goals and Risk Factor Review    Row Name 04/05/20 0938             Core Components/Risk Factors/Patient Goals Review   Personal Goals Review Weight Management/Obesity;Diabetes;Hypertension;Lipids       Review Cory Sosa started exercise on 04/04/20. Cory Sosa did well with exercise vital sign and CBG's stable.       Expected Outcomes Steadman will continue to participate in phase 2 cardiac rehab for exercise nutrtion and lifestyle modifications              Core Components/Risk Factors/Patient Goals at Discharge (Final Review):   Goals and Risk Factor Review - 04/05/20 0938      Core Components/Risk Factors/Patient Goals Review   Personal Goals Review Weight Management/Obesity;Diabetes;Hypertension;Lipids    Review Cory Sosa started exercise on 04/04/20. Cory Sosa did well with exercise vital sign and CBG's stable.    Expected  Outcomes Cory Sosa will continue to participate in phase 2 cardiac rehab for exercise nutrtion and lifestyle modifications           ITP Comments:  ITP Comments    Row Name 03/31/20 0832 04/05/20 0929         ITP Comments Dr Armanda Magic MD, Medical Director 30 Day ITP Review. Dandrea started exercise on 04/04/20. Patient did well with exercise. Vital sings stable             Comments:Cory Sosa started cardiac rehab today.  Pt tolerated light exercise without difficulty. VSS, telemetry-Sinus Rhythm, asymptomatic.  Medication list reconciled. Pt denies barriers to medicaiton compliance.  PSYCHOSOCIAL ASSESSMENT:  PHQ-1. Pt exhibits positive coping skills, hopeful outlook with supportive family. No psychosocial needs identified at this time, no psychosocial interventions necessary.    Pt enjoys participating in outdoor activities.   Pt oriented to exercise equipment and routine.    Understanding verbalized.Cory Sosa short and long term goals are to be able to walk his route again and to regain confidence to resume his hobbies.Cory Lighter, RN,BSN 04/05/2020 10:29 AM

## 2020-04-06 ENCOUNTER — Encounter (HOSPITAL_COMMUNITY)
Admission: RE | Admit: 2020-04-06 | Discharge: 2020-04-06 | Disposition: A | Discharge status: Home / Self Care | Payer: Medicare Other | Source: Ambulatory Visit | Attending: Cardiology | Admitting: Cardiology

## 2020-04-06 ENCOUNTER — Other Ambulatory Visit: Payer: Self-pay

## 2020-04-06 DIAGNOSIS — Z955 Presence of coronary angioplasty implant and graft: Secondary | ICD-10-CM | POA: Diagnosis not present

## 2020-04-06 DIAGNOSIS — I2102 ST elevation (STEMI) myocardial infarction involving left anterior descending coronary artery: Secondary | ICD-10-CM

## 2020-04-06 NOTE — Progress Notes (Signed)
QUALITY OF LIFE SCORE REVIEW  Pt completed Quality of Life survey as a participant in Cardiac Rehab. Scores 21.0 or below are considered low. Pt score very low in health and functioning and psychosocial spiritual. Overall 22.83, Health and Function 19.57, socioeconomic 30, physiological and spiritual 20.21, family 24.80. Patient quality of life slightly altered by physical constraints which limits ability to perform as prior to recent cardiac illness. Terral says that he is dissatisfied with his health due to his recent MI/Stent. Demauri denies being depressed currently but is "shocked" that he had a heart attack.  Offered emotional support and reassurance.  Will continue to monitor and intervene as necessary.Gladstone Lighter, RN,BSN 04/06/2020 12:06 PM

## 2020-04-06 NOTE — Telephone Encounter (Signed)
From patient.

## 2020-04-08 ENCOUNTER — Other Ambulatory Visit: Payer: Self-pay

## 2020-04-08 ENCOUNTER — Encounter (HOSPITAL_COMMUNITY)
Admission: RE | Admit: 2020-04-08 | Discharge: 2020-04-08 | Disposition: A | Discharge status: Home / Self Care | Payer: Medicare Other | Source: Ambulatory Visit | Attending: Cardiology | Admitting: Cardiology

## 2020-04-08 DIAGNOSIS — Z955 Presence of coronary angioplasty implant and graft: Secondary | ICD-10-CM

## 2020-04-08 DIAGNOSIS — I2102 ST elevation (STEMI) myocardial infarction involving left anterior descending coronary artery: Secondary | ICD-10-CM

## 2020-04-11 ENCOUNTER — Encounter (HOSPITAL_COMMUNITY): Payer: Medicare Other

## 2020-04-13 ENCOUNTER — Other Ambulatory Visit: Payer: Self-pay

## 2020-04-13 ENCOUNTER — Encounter (HOSPITAL_COMMUNITY)
Admission: RE | Admit: 2020-04-13 | Discharge: 2020-04-13 | Disposition: A | Discharge status: Home / Self Care | Payer: Medicare Other | Source: Ambulatory Visit | Attending: Cardiology | Admitting: Cardiology

## 2020-04-13 DIAGNOSIS — Z955 Presence of coronary angioplasty implant and graft: Secondary | ICD-10-CM | POA: Diagnosis not present

## 2020-04-13 DIAGNOSIS — I2102 ST elevation (STEMI) myocardial infarction involving left anterior descending coronary artery: Secondary | ICD-10-CM

## 2020-04-13 NOTE — Progress Notes (Signed)
[name] 65 y.o. male Nutrition Note  Visit Diagnosis: ST elevation myocardial infarction involving left anterior descending (LAD) coronary artery (HCC) 02/07/20  S/P drug eluting coronary stent placement x 2 LAD 02/07/20  Past Medical History:  Diagnosis Date  . Coronary artery disease   . Diabetes mellitus without complication (HCC)   . Hyperlipidemia   . Hypertension    controlled with medications     Medications reviewed.   Current Outpatient Medications:  .  aspirin 81 MG chewable tablet, Chew 1 tablet (81 mg total) by mouth daily., Disp: , Rfl:  .  Canagliflozin-metFORMIN HCl (INVOKAMET PO), Take 1,000 mg by mouth 2 (two) times daily. 50-1000, Disp: , Rfl:  .  carvedilol (COREG) 6.25 MG tablet, Take 1 tablet (6.25 mg total) by mouth 2 (two) times daily., Disp: 180 tablet, Rfl: 3 .  Cyanocobalamin (VITAMIN B-12 PO), Take 2 tablets by mouth at bedtime. , Disp: , Rfl:  .  ergocalciferol (VITAMIN D2) 1.25 MG (50000 UT) capsule, Take 50,000 Units by mouth once a week. Sunday, Disp: , Rfl:  .  insulin aspart (NOVOLOG FLEXPEN) 100 UNIT/ML FlexPen, Inject 24 Units into the skin 3 (three) times daily with meals. And 10 units with a snack, Disp: , Rfl:  .  insulin glargine, 2 Unit Dial, (TOUJEO MAX SOLOSTAR) 300 UNIT/ML Solostar Pen, Inject 60 Units into the skin daily., Disp: , Rfl:  .  insulin isophane & regular human (HUMULIN 70/30 MIX) (70-30) 100 UNIT/ML KwikPen, Inject 55 Units into the skin 2 (two) times daily.  (Patient not taking: Reported on 03/31/2020), Disp: , Rfl:  .  losartan (COZAAR) 25 MG tablet, Take 0.5 tablets (12.5 mg total) by mouth every evening., Disp: , Rfl:  .  Multiple Vitamin tablet, Take 1 tablet by mouth daily., Disp: , Rfl:  .  nitroGLYCERIN (NITROSTAT) 0.4 MG SL tablet, Place 1 tablet (0.4 mg total) under the tongue every 5 (five) minutes as needed for chest pain., Disp: 25 tablet, Rfl: 1 .  nystatin ointment (MYCOSTATIN), Apply 1 application topically 2  (two) times daily as needed. Balanitis, Disp: , Rfl:  .  pantoprazole (PROTONIX) 40 MG tablet, Take 40 mg by mouth daily., Disp: , Rfl:  .  rosuvastatin (CRESTOR) 20 MG tablet, Take 20 mg by mouth at bedtime., Disp: , Rfl:  .  ticagrelor (BRILINTA) 90 MG TABS tablet, Take 1 tablet (90 mg total) by mouth 2 (two) times daily., Disp: 60 tablet, Rfl: 3 .  zolpidem (AMBIEN) 10 MG tablet, Take 5 mg by mouth at bedtime as needed for sleep. , Disp: , Rfl: 2   Ht Readings from Last 1 Encounters:  03/31/20 5\' 9"  (1.753 m)     Wt Readings from Last 3 Encounters:  03/31/20 238 lb 15.7 oz (108.4 kg)  03/15/20 238 lb (108 kg)  02/15/20 235 lb (106.6 kg)     There is no height or weight on file to calculate BMI.   Social History   Tobacco Use  Smoking Status Never Smoker  Smokeless Tobacco Never Used     Lab Results  Component Value Date   CHOL 116 02/07/2020   Lab Results  Component Value Date   HDL 42 02/07/2020   Lab Results  Component Value Date   LDLCALC 33 02/07/2020   Lab Results  Component Value Date   TRIG 207 (H) 02/07/2020     Lab Results  Component Value Date   HGBA1C 8.1 (H) 02/07/2020     CBG (  last 3)  No results for input(s): GLUCAP in the last 72 hours.   Nutrition Note  Spoke with pt. Nutrition Plan and Nutrition Survey goals reviewed with pt. He has been seeing a Dietitian for the past year and already made some heart healthy changes to his diet.   Pt has Type 2 Diabetes.  He uses freestyle libre CGM. Reviewed CGM report with pt. He pointed out areas he wanted to improve. He typically overcompensates for CBG <80 mg/dl. We reviewed hypoglycemia protocol. Provided handout for reinforcement. When he has CBGs > 200 mg/dl, he gives himself a correction dose of insulin. He picks his dose randomly - typically 10 units.This causes him to drop. Will review correction factor and insulin to carb ratio at follow up.  Continuous glucose monitoring download:       Average is    165 mg/dl  for 14 days   Time sensor is active   86 %   Time in range (70-180 mg/dL):  73 % (Goal >40%)   Time High (181-250 mg/dL)  24 % (Goal < 98%)   Time Very High (>250 mg/dl)  3 % (Goal < 5%)   Time Low (54-69 mg/dL)  0 % (Goal is <1%)   Time Very Low (<54)  0%  (Goal <1%)   Coefficient of variation  26.4% (Goal is <36%)      Heart healthy diet: he has tried to create some heart healthy eating habits. He tries to buy lower sodium foods. He uses the salt shaker sometimes. He is reducing saturated fat, trans fats, and sugar. Eliminated SSB. He has cut back on fast food as well.  He uses My[employer] app on his phone to determine carb and calorie intake.   Pt expressed understanding of the information reviewed.    Nutrition Diagnosis ? Excessive carbohydrate intake related to knowledge deficit for treatment of hypoglycemia as evidenced reactive hyperglycemia of >300 mg/dl following treatment of CBGs <70 mg/dl  Nutrition Intervention ? Pt's individual nutrition plan reviewed with pt. ? Carbohydrate modified diet ? Meal timing to reduce hypoglycemia. ? Continue client-centered nutrition education by RD, as part of interdisciplinary care.  Goal(s) ? Pt to learn diabetes meal planning (carb counting, insulin to carb ratio, and correction factors). ? Pt to identify food quantities necessary to achieve weight loss of 6-24 lb at graduation from cardiac rehab.  ? Pt to build a healthy plate including vegetables, fruits, whole grains, and low-fat dairy products in a heart healthy meal plan. ? Maintain glycemic target of TIR >70%  Plan:   Will provide client-centered nutrition education as part of interdisciplinary care  Monitor and evaluate progress toward nutrition goal with team.   Andrey Campanile, MS, RDN, LDN

## 2020-04-15 ENCOUNTER — Other Ambulatory Visit: Payer: Self-pay

## 2020-04-15 ENCOUNTER — Encounter (HOSPITAL_COMMUNITY)
Admission: RE | Admit: 2020-04-15 | Discharge: 2020-04-15 | Disposition: A | Discharge status: Home / Self Care | Payer: Medicare Other | Source: Ambulatory Visit | Attending: Cardiology | Admitting: Cardiology

## 2020-04-15 DIAGNOSIS — I10 Essential (primary) hypertension: Secondary | ICD-10-CM

## 2020-04-15 DIAGNOSIS — I2510 Atherosclerotic heart disease of native coronary artery without angina pectoris: Secondary | ICD-10-CM

## 2020-04-15 DIAGNOSIS — I2102 ST elevation (STEMI) myocardial infarction involving left anterior descending coronary artery: Secondary | ICD-10-CM

## 2020-04-15 DIAGNOSIS — Z955 Presence of coronary angioplasty implant and graft: Secondary | ICD-10-CM | POA: Diagnosis not present

## 2020-04-15 DIAGNOSIS — I252 Old myocardial infarction: Secondary | ICD-10-CM

## 2020-04-15 MED ORDER — TICAGRELOR 90 MG PO TABS
90.0000 mg | ORAL_TABLET | Freq: Two times a day (BID) | ORAL | 3 refills | Status: DC
Start: 2020-04-15 — End: 2020-04-26

## 2020-04-15 MED ORDER — CARVEDILOL 6.25 MG PO TABS: 6.2500 mg | ORAL_TABLET | Freq: Two times a day (BID) | ORAL | 3 refills | Status: DC

## 2020-04-18 ENCOUNTER — Other Ambulatory Visit: Payer: Self-pay

## 2020-04-18 ENCOUNTER — Encounter (HOSPITAL_COMMUNITY)
Admission: RE | Admit: 2020-04-18 | Discharge: 2020-04-18 | Disposition: A | Discharge status: Home / Self Care | Payer: Medicare Other | Source: Ambulatory Visit | Attending: Cardiology | Admitting: Cardiology

## 2020-04-18 DIAGNOSIS — Z955 Presence of coronary angioplasty implant and graft: Secondary | ICD-10-CM | POA: Diagnosis not present

## 2020-04-18 DIAGNOSIS — I2102 ST elevation (STEMI) myocardial infarction involving left anterior descending coronary artery: Secondary | ICD-10-CM

## 2020-04-19 NOTE — Progress Notes (Signed)
Reviewed home exercise Rx with patient today. [place] times [place] week or uses his treadmill. Encouraged warm-up, cool-down, and stretching when exercising on his own. Encouraged 2-3 x/week for 30-45 minutes. Discussed THRR of 62-125 and keeping RPE between 11-13 with activity. Reviewed weather parameters for temperature and humidity. Encouraged hydration before, during and after activity. Reviewed use of NTG and pt voices carry his at all times. Reviewed S/S that would require patient to terminate exercise and when to call 911 vs MD. Pt verbalized understanding of the hone exercise Rx and was provided a copy.  Lorin Picket MS, ACSM-EP-C, CCRP

## 2020-04-20 ENCOUNTER — Other Ambulatory Visit: Payer: Self-pay

## 2020-04-20 ENCOUNTER — Encounter (HOSPITAL_COMMUNITY)
Admission: RE | Admit: 2020-04-20 | Discharge: 2020-04-20 | Disposition: A | Discharge status: Home / Self Care | Payer: Medicare Other | Source: Ambulatory Visit | Attending: Cardiology | Admitting: Cardiology

## 2020-04-20 DIAGNOSIS — Z955 Presence of coronary angioplasty implant and graft: Secondary | ICD-10-CM | POA: Diagnosis not present

## 2020-04-20 DIAGNOSIS — I2102 ST elevation (STEMI) myocardial infarction involving left anterior descending coronary artery: Secondary | ICD-10-CM

## 2020-04-20 NOTE — Progress Notes (Signed)
Christipher reports that he feels like someone is pressing onto his sternum intermittently on a daily basis randomly. [place] has not reported having this sensation at cardiac rehab but did feel some"pressure after talking about it." Oakes says he has been having these sensations after his discharge from the hospital in November. Entry blood pressure 122/78. Heart rate 86. Oxygen saturation 98% on room air. Will notify Dr Verl Dicker office and send today's ECG tracings to Dr Verdene Rio office for review.Gladstone Lighter, RN,BSN 04/20/2020 9:48 AM

## 2020-04-22 ENCOUNTER — Other Ambulatory Visit: Payer: Self-pay

## 2020-04-22 ENCOUNTER — Encounter (HOSPITAL_COMMUNITY)
Admission: RE | Admit: 2020-04-22 | Discharge: 2020-04-22 | Disposition: A | Discharge status: Home / Self Care | Payer: Medicare Other | Source: Ambulatory Visit | Attending: Cardiology | Admitting: Cardiology

## 2020-04-22 DIAGNOSIS — Z955 Presence of coronary angioplasty implant and graft: Secondary | ICD-10-CM | POA: Diagnosis not present

## 2020-04-22 DIAGNOSIS — I2102 ST elevation (STEMI) myocardial infarction involving left anterior descending coronary artery: Secondary | ICD-10-CM

## 2020-04-25 ENCOUNTER — Encounter (HOSPITAL_COMMUNITY)
Admission: RE | Admit: 2020-04-25 | Discharge: 2020-04-25 | Disposition: A | Discharge status: Home / Self Care | Payer: Medicare Other | Source: Ambulatory Visit | Attending: Cardiology | Admitting: Cardiology

## 2020-04-25 ENCOUNTER — Other Ambulatory Visit: Payer: Self-pay

## 2020-04-25 DIAGNOSIS — I2102 ST elevation (STEMI) myocardial infarction involving left anterior descending coronary artery: Secondary | ICD-10-CM

## 2020-04-25 DIAGNOSIS — Z955 Presence of coronary angioplasty implant and graft: Secondary | ICD-10-CM

## 2020-04-25 NOTE — Telephone Encounter (Signed)
Cory Sosa, he needs OV. Celeste or me is fine. Scheduled for 04/26/2020 9:15 AM (Arrive by 9:00 AM)

## 2020-04-25 NOTE — Progress Notes (Signed)
Primary Physician/Referring:  [name], MD  Patient ID: Cory Sosa, male    DOB: 1955/11/03, 65 y.o.   MRN: [id number]  Chief Complaint  Patient presents with  . Coronary Artery Disease  . Congestive Heart Failure    6 weeks   HPI:    Cory Sosa  is a 65 y.o. male patient with hypertension, hyperlipidemia, diabetes mellitus, with recent ST elevation MI 02/07/2020 with successful PCI and stenting to the proximal and mid LAD.  Patient has been unable to tolerate metoprolol in the past due to frequent bowel movements.   Patient presents for 6 week follow up for coronary artery disease and heart failure. At last visit restarted losartan 25 mg daily. Since last visit patient has been doing well in cardiac rehab without issue. However, he has reported intermittent non-exertional substernal chest pressure on a daily basis lasting <1 minute. Patient provides home blood pressure and heart rate readings that are well controlled. Patient does continue to have occasional episodes of low blood pressure and lightheadedness since going back up to losartan 25 mg daily. Denies shortness of breath, dizziness, fatigue, palpitations, syncope, near-syncope, leg swelling, PND, orthopnea.    Past Medical History:  Diagnosis Date  . Coronary artery disease   . Diabetes mellitus without complication (HCC)   . Hyperlipidemia   . Hypertension    controlled with medications   Past Surgical History:  Procedure Laterality Date  . CARDIAC CATHETERIZATION    . CORONARY THROMBECTOMY N/A 02/07/2020   Procedure: Coronary Thrombectomy;  Surgeon: Yates Decamp, MD;  Location: [facility] INVASIVE CV LAB;  Service: Cardiovascular;  Laterality: N/A;  . CORONARY/GRAFT ACUTE MI REVASCULARIZATION N/A 02/07/2020   Procedure: Coronary/Graft Acute MI Revascularization;  Surgeon: Yates Decamp, MD;  Location: [facility] INVASIVE CV LAB;  Service: Cardiovascular;  Laterality: N/A;  . LEFT HEART CATH AND CORONARY ANGIOGRAPHY N/A 02/07/2020    Procedure: LEFT HEART CATH AND CORONARY ANGIOGRAPHY;  Surgeon: Yates Decamp, MD;  Location: [facility] INVASIVE CV LAB;  Service: Cardiovascular;  Laterality: N/A;   Family History  Problem Relation Age of Onset  . Pneumonia Mother     Social History   Tobacco Use  . Smoking status: Never Smoker  . Smokeless tobacco: Never Used  Substance Use Topics  . Alcohol use: Never   Marital Status: Married   ROS  Review of Systems  Constitutional: Negative for malaise/fatigue and weight gain.  Cardiovascular: Positive for chest pain. Negative for claudication, leg swelling, near-syncope, orthopnea, palpitations, paroxysmal nocturnal dyspnea and syncope.  Respiratory: Negative for shortness of breath.   Hematologic/Lymphatic: Does not bruise/bleed easily.  Gastrointestinal: Negative for melena.  Neurological: Negative for dizziness and weakness.    Objective  Blood pressure 131/86, pulse 73, temperature 98.1 F (36.7 C), temperature source Temporal, resp. rate 16, height 5\' 9"  (1.753 m), weight 240 lb 12.8 oz (109.2 kg), SpO2 98 %.  Vitals with BMI 04/26/2020 03/31/2020 03/15/2020  Height 5\' 9"  5\' 9"  5\' 10"   Weight 240 lbs 13 oz 239 lbs 238 lbs  BMI 35.54 35.27 34.15  Systolic 131 114 470  Diastolic 86 72 86  Pulse 73 - 43      Physical Exam Vitals reviewed.  Constitutional:      Appearance: He is obese.  HENT:     Head: Normocephalic and atraumatic.  Cardiovascular:     Rate and Rhythm: Normal rate and regular rhythm.  No extrasystoles are present.    Pulses: Intact distal pulses.  Heart sounds: S1 normal and S2 normal. No murmur heard. No gallop.      Comments: No JVD, No edema.   Pulmonary:     Effort: Pulmonary effort is normal. No respiratory distress.     Breath sounds: No wheezing, rhonchi or rales.  Musculoskeletal:     Right lower leg: No edema.     Left lower leg: No edema.  Skin:    General: Skin is warm and dry.     Findings: No erythema. Rash:   Neurological:      Mental Status: He is alert.     Laboratory examination:   Recent Labs    02/07/20 2018 02/07/20 2050 02/08/20 0612  NA 140 140 139  K 3.5 3.3* 4.9  CL 105 107 104  CO2 24  --  23  GLUCOSE 178* 166* 325*  BUN 14 14 15   CREATININE 0.97 0.80 1.06  CALCIUM 9.5  --  9.1  GFRNONAA >60  --  >60   CrCl cannot be calculated (Patient's most recent lab result is older than the maximum 21 days allowed.).  CMP Latest Ref Rng & Units 02/08/2020 02/07/2020 02/07/2020  Glucose 70 - 99 mg/dL 02/09/2020) 212(Y) 482(N)  BUN 8 - 23 mg/dL 15 14 14   Creatinine 0.61 - 1.24 mg/dL 003(B 0.48  Sodium 135 - 145 mmol/L 139 140 140  Potassium 3.5 - 5.1 mmol/L 4.9 3.3(L) 3.5  Chloride 98 - 111 mmol/L 104 107 105  CO2 22 - 32 mmol/L 23 - 24  Calcium 8.9 - 10.3 mg/dL 9.1 - 9.5  Total Protein 6.5 - 8.1 g/dL - - 6.8  Total Bilirubin 0.3 - 1.2 mg/dL - - 0.5  Alkaline Phos 38 - 126 U/L - - 57  AST 15 - 41 U/L - - 45(H)  ALT 0 - 44 U/L - - 72(H)   CBC Latest Ref Rng & Units 02/08/2020 02/07/2020 02/07/2020  WBC 4.0 - 10.5 K/uL 8.0 - 10.3  Hemoglobin 13.0 - 17.0 g/dL 02/09/2020 02/09/2020 45.0  Hematocrit 39.0 - 52.0 % 42.8 40.0 45.0  Platelets 150 - 400 K/uL 275 - 302    Lipid Panel Recent Labs    02/07/20 2018  CHOL 116  TRIG 207*  LDLCALC 33  VLDL 41*  HDL 42  CHOLHDL 2.8    HEMOGLOBIN A1C Lab Results  Component Value Date   HGBA1C 8.1 (H) 02/07/2020   MPG 185.77 02/07/2020   TSH No results for input(s): TSH in the last 8760 hours.  External labs:  None   Medications and allergies   Allergies  Allergen Reactions  . Contrast Media [Iodinated Diagnostic Agents] Nausea And Vomiting  . Rocephin [Ceftriaxone Sodium In Dextrose] Nausea And Vomiting     Outpatient Medications Prior to Visit  Medication Sig Dispense Refill  . aspirin 81 MG chewable tablet Chew 1 tablet (81 mg total) by mouth daily.    . Canagliflozin-metFORMIN HCl (INVOKAMET PO) Take 1,000 mg by mouth 2 (two) times daily.  50-1000    . Cyanocobalamin (VITAMIN B-12 PO) Take 2 tablets by mouth at bedtime.     . ergocalciferol (VITAMIN D2) 1.25 MG (50000 UT) capsule Take 50,000 Units by mouth once a week. Sunday    . insulin aspart (NOVOLOG FLEXPEN) 100 UNIT/ML FlexPen Inject 24 Units into the skin 3 (three) times daily with meals. And 10 units with a snack    . insulin glargine, 2 Unit Dial, (TOUJEO MAX SOLOSTAR) 300 UNIT/ML Solostar Pen Inject 60  Units into the skin daily.    . Multiple Vitamin tablet Take 1 tablet by mouth daily.    . nitroGLYCERIN (NITROSTAT) 0.4 MG SL tablet Place 1 tablet (0.4 mg total) under the tongue every 5 (five) minutes as needed for chest pain. 25 tablet 1  . nystatin ointment (MYCOSTATIN) Apply 1 application topically 2 (two) times daily as needed. Balanitis    . pantoprazole (PROTONIX) 40 MG tablet Take 40 mg by mouth daily.    . rosuvastatin (CRESTOR) 20 MG tablet Take 20 mg by mouth at bedtime.    Marland Kitchen zolpidem (AMBIEN) 10 MG tablet Take 5 mg by mouth at bedtime as needed for sleep.   2  . carvedilol (COREG) 6.25 MG tablet Take 1 tablet (6.25 mg total) by mouth 2 (two) times daily. 180 tablet 3  . losartan (COZAAR) 25 MG tablet Take 0.5 tablets (12.5 mg total) by mouth every evening.    . ticagrelor (BRILINTA) 90 MG TABS tablet Take 1 tablet (90 mg total) by mouth 2 (two) times daily. 180 tablet 3  . insulin isophane & regular human (HUMULIN 70/30 MIX) (70-30) 100 UNIT/ML KwikPen Inject 55 Units into the skin 2 (two) times daily.     No facility-administered medications prior to visit.     Radiology:   No results found. DG Chest Portable 1 View 02/07/2020 CLINICAL DATA:  Chest pain, dizziness, nausea EXAM: PORTABLE CHEST 1 VIEW COMPARISON:  03/03/2018 FINDINGS: Single frontal view of the chest demonstrates an unremarkable cardiac silhouette. No airspace disease, effusion, or pneumothorax. No acute bony abnormality.   Cardiac Studies:   Coronary angiography and angioplasty  02/07/2020: RCA: Dominant, mild disease throughout. Left main: Smooth and normal. Circumflex: Moderate sized, mild diffuse disease. Ramus intermediate: Mild disease. Small. LAD: Large vessel. Occluded after the origin of a large D1. Successful thrombectomy followed by overlapping 4.0 x 38 resolute Onyx and 3.5 x 12 mm Synergy DES deployed at 12 atmospheric pressure each, distal stent was postdilated with the same stent balloon at 16 atmospheric pressure at the overlap. TIMI 0 to TIMI III flow improved, no evidence of edge dissection. 100 mL contrast utilized.  Recommendation: Patient will be observed in the intensive care unit. He will be started on dual antiplatelet therapy and aggressive risk factor modification will be continued.  Echocardiogram 02/08/2020:  1. Left ventricular ejection fraction, by estimation, is 40 to 45%. The left ventricle has mildly decreased function. The left ventricle demonstrates regional wall motion abnormalities (see scoring diagram/findings for description). There is mild left  ventricular hypertrophy. Left ventricular diastolic parameters were normal. There is mild hypokinesis of the left ventricular, mid-apical anterolateral wall and anterior wall. 2. Right ventricular systolic function is normal. The right ventricular size is normal. There is normal pulmonary artery systolic pressure. 3. Left atrial size was mildly dilated. 4. The mitral valve is normal in structure. No evidence of mitral valve regurgitation. 5. The aortic valve is tricuspid. There is mild calcification of the aortic valve. There is mild thickening of the aortic valve. Aortic valve regurgitation is not visualized.  EKG:   EKG 04/26/2020: Sinus rhythm at a rate of 63 bpm, left atrial enlargement.  Normal axis.  No evidence of ischemia or underlying injury pattern.  Compared to EKG 03/15/2020, no PVCs present.   EKG 02/15/2020: Sinus rhythm at a rate of 62 bpm, left atrial enlargement.   Normal axis.  Poor R wave progression, cannot exclude anteroseptal infarct old.  Nonspecific T wave abnormality.  Compared  to EKG 02/08/2020, no significant change.  EKG 02/08/2020: Normal sinus rhythm/sinus bradycardia at rate of 59 bpm, normal axis, nonspecific T abnormality in the anterolateral leads.  EKG 02/07/2020: Normal sinus rhythm at a rate of 76 bpm, hyperacute T waves changes in the anterolateral leads in V1 to V4 suggestive of acute STEMI in the anterolateral wall. This is new compared to prior EKG from 2019.  Assessment     ICD-10-CM   1. Coronary artery disease involving native coronary artery of native heart without angina pectoris  I25.10 EKG 12-Lead    losartan (COZAAR) 25 MG tablet    PCV ECHOCARDIOGRAM COMPLETE    carvedilol (COREG) 6.25 MG tablet    ticagrelor (BRILINTA) 90 MG TABS tablet  2. Essential hypertension  I10 losartan (COZAAR) 25 MG tablet    carvedilol (COREG) 6.25 MG tablet  3. Chronic systolic (congestive) heart failure (HCC)  I50.22 PCV ECHOCARDIOGRAM COMPLETE  4. History of ST elevation myocardial infarction (STEMI)  I25.2 carvedilol (COREG) 6.25 MG tablet    ticagrelor (BRILINTA) 90 MG TABS tablet     Medications Discontinued During This Encounter  Medication Reason  . losartan (COZAAR) 25 MG tablet   . carvedilol (COREG) 6.25 MG tablet   . ticagrelor (BRILINTA) 90 MG TABS tablet Reorder    Meds ordered this encounter  Medications  . losartan (COZAAR) 25 MG tablet    Sig: Take 1 tablet (25 mg total) by mouth every evening.    Dispense:  90 tablet    Refill:  3  . carvedilol (COREG) 6.25 MG tablet    Sig: Take 2 tablets (12.5 mg total) by mouth 2 (two) times daily.    Dispense:  180 tablet    Refill:  3  . ticagrelor (BRILINTA) 90 MG TABS tablet    Sig: Take 1 tablet (90 mg total) by mouth 2 (two) times daily.    Dispense:  180 tablet    Refill:  3    Recommendations:   Barnett Elzey is a 65 y.o. male patient with hypertension,  hyperlipidemia, diabetes mellitus, with recent ST elevation MI 02/07/2020 with successful PCI and stenting to the proximal and mid LAD.  Patient presents for 6-week follow-up of coronary artery disease and heart failure.  He is presently doing well overall, has started cardiac rehab and is progressing well.  Blood pressure remains well controlled and he has been tolerating losartan 25 mg daily well.  Patient does continue to have intermittent chest discomfort which he describes as substernal pressure.  This pressure is not triggered by exertion and lasts less than 1 minute, and is without associated symptoms.  In view of ongoing intermittent chest discomfort will increase carvedilol to 12.5 mg twice daily from 6.25 mg twice daily.  Counseled patient regarding close monitoring of blood pressure and heart rate at home.  Patient will notify our office if he experiences symptoms of hypotension, could consider reducing losartan dose in order to allow for increased dose of carvedilol for antianginal benefits.  We will continue aspirin, rosuvastatin, Brilinta, and as needed nitroglycerin.  EKG today did not reveal PVCs and patient remains without symptoms of palpitations.  We will continue to monitor.  Will obtain repeat echocardiogram to reevaluate heart function now that patient is on guideline directed medical therapy for heart failure. Counseled patient regarding signs and symptoms that would warrant urgent evaluation in the emergency department, he verbalized understanding and agreement.  Follow-up in 6 weeks, sooner if needed, for angina and heart failure.  Rayford HalstedCeleste C Nirvaan Frett, PA-C 04/26/2020, 1:32 PM Office: [phone]

## 2020-04-26 ENCOUNTER — Other Ambulatory Visit: Payer: Self-pay

## 2020-04-26 ENCOUNTER — Telehealth: Payer: Self-pay

## 2020-04-26 ENCOUNTER — Ambulatory Visit: Payer: Medicare Other | Admitting: Student

## 2020-04-26 ENCOUNTER — Encounter: Payer: Self-pay | Admitting: Student

## 2020-04-26 VITALS — BP 131/86 | HR 73 | Temp 98.1°F | Resp 16 | Ht 69.0 in | Wt 240.8 lb

## 2020-04-26 DIAGNOSIS — I5022 Chronic systolic (congestive) heart failure: Secondary | ICD-10-CM

## 2020-04-26 DIAGNOSIS — I2510 Atherosclerotic heart disease of native coronary artery without angina pectoris: Secondary | ICD-10-CM

## 2020-04-26 DIAGNOSIS — I252 Old myocardial infarction: Secondary | ICD-10-CM

## 2020-04-26 DIAGNOSIS — I10 Essential (primary) hypertension: Secondary | ICD-10-CM

## 2020-04-26 MED ORDER — TICAGRELOR 90 MG PO TABS: 90.0000 mg | ORAL_TABLET | Freq: Two times a day (BID) | ORAL | 3 refills | Status: DC

## 2020-04-26 MED ORDER — LOSARTAN POTASSIUM 25 MG PO TABS: 25.0000 mg | ORAL_TABLET | Freq: Every evening | ORAL | 3 refills | Status: DC

## 2020-04-26 MED ORDER — CARVEDILOL 6.25 MG PO TABS: 12.5000 mg | ORAL_TABLET | Freq: Two times a day (BID) | ORAL | 3 refills | Status: DC

## 2020-04-26 NOTE — Telephone Encounter (Signed)
I faxed brilinta Patient assistance to [place]

## 2020-04-27 ENCOUNTER — Encounter (HOSPITAL_COMMUNITY)
Admission: RE | Admit: 2020-04-27 | Discharge: 2020-04-27 | Disposition: A | Discharge status: Home / Self Care | Payer: Medicare Other | Source: Ambulatory Visit | Attending: Cardiology | Admitting: Cardiology

## 2020-04-27 ENCOUNTER — Other Ambulatory Visit: Payer: Self-pay

## 2020-04-27 DIAGNOSIS — I2102 ST elevation (STEMI) myocardial infarction involving left anterior descending coronary artery: Secondary | ICD-10-CM

## 2020-04-27 DIAGNOSIS — Z955 Presence of coronary angioplasty implant and graft: Secondary | ICD-10-CM | POA: Insufficient documentation

## 2020-04-27 DIAGNOSIS — I252 Old myocardial infarction: Secondary | ICD-10-CM | POA: Insufficient documentation

## 2020-04-29 ENCOUNTER — Encounter (HOSPITAL_COMMUNITY)
Admission: RE | Admit: 2020-04-29 | Discharge: 2020-04-29 | Disposition: A | Discharge status: Home / Self Care | Payer: Medicare Other | Source: Ambulatory Visit | Attending: Cardiology | Admitting: Cardiology

## 2020-04-29 ENCOUNTER — Other Ambulatory Visit: Payer: Self-pay

## 2020-04-29 DIAGNOSIS — Z955 Presence of coronary angioplasty implant and graft: Secondary | ICD-10-CM | POA: Diagnosis not present

## 2020-04-29 DIAGNOSIS — I2102 ST elevation (STEMI) myocardial infarction involving left anterior descending coronary artery: Secondary | ICD-10-CM

## 2020-05-02 ENCOUNTER — Encounter (HOSPITAL_COMMUNITY)
Admission: RE | Admit: 2020-05-02 | Discharge: 2020-05-02 | Disposition: A | Discharge status: Home / Self Care | Payer: Medicare Other | Source: Ambulatory Visit | Attending: Cardiology | Admitting: Cardiology

## 2020-05-02 ENCOUNTER — Other Ambulatory Visit: Payer: Self-pay

## 2020-05-02 DIAGNOSIS — Z955 Presence of coronary angioplasty implant and graft: Secondary | ICD-10-CM | POA: Diagnosis not present

## 2020-05-02 DIAGNOSIS — I2102 ST elevation (STEMI) myocardial infarction involving left anterior descending coronary artery: Secondary | ICD-10-CM

## 2020-05-02 NOTE — Progress Notes (Signed)
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  aspirin 81 MG chewable tablet, Chew 1 tablet (81 mg total) by mouth daily., Disp: , Rfl:  .  Canagliflozin-metFORMIN HCl (INVOKAMET PO), Take 1,000 mg by mouth 2 (two) times daily. 50-1000, Disp: , Rfl:  .  carvedilol (COREG) 6.25 MG tablet, Take 2 tablets (12.5 mg total) by mouth 2 (two) times daily., Disp: 180 tablet, Rfl: 3 .  ergocalciferol (VITAMIN D2) 1.25 MG (50000 UT) capsule, Take 50,000 Units by mouth once a week. Sunday, Disp: , Rfl:  .  insulin aspart (NOVOLOG FLEXPEN) 100 UNIT/ML FlexPen, Inject 24 Units into the skin 3 (three) times daily with meals. And 10 units with a snack, Disp: , Rfl:  .  insulin glargine, 2 Unit Dial, (TOUJEO MAX SOLOSTAR) 300 UNIT/ML Solostar Pen, Inject 60 Units into the skin daily., Disp: , Rfl:  .  losartan (COZAAR) 25 MG tablet, Take 1 tablet (25 mg total) by mouth every evening., Disp: 90 tablet, Rfl: 3 .  Multiple Vitamin tablet, Take 1 tablet by mouth daily., Disp: , Rfl:  .  nitroGLYCERIN (NITROSTAT) 0.4 MG SL tablet, Place 1 tablet (0.4 mg total) under the tongue every 5 (five) minutes as needed for chest pain., Disp: 25 tablet, Rfl: 1 .  nystatin ointment (MYCOSTATIN), Apply 1 application topically 2 (two) times daily as needed. Balanitis, Disp: , Rfl:   .  pantoprazole (PROTONIX) 40 MG tablet, Take 40 mg by mouth daily., Disp: , Rfl:  .  rosuvastatin (CRESTOR) 20 MG tablet, Take 20 mg by mouth at bedtime., Disp: , Rfl:  .  ticagrelor (BRILINTA) 90 MG TABS tablet, Take 1 tablet (90 mg total) by mouth 2 (two) times daily., Disp: 180 tablet, Rfl: 3 .  zolpidem (AMBIEN) 10 MG tablet, Take 5 mg by mouth at bedtime as needed for sleep. , Disp: , Rfl: 2  Past Medical History: Past Medical History:  Diagnosis Date  . Coronary artery disease   . Diabetes mellitus without complication (HCC)   . Hyperlipidemia   . Hypertension    controlled with medications    Tobacco Use: Social History   Tobacco Use  Smoking Status Never Smoker  Smokeless Tobacco Never Used    Labs: Recent Review Advice worker    Labs for ITP Cardiac and Pulmonary Rehab Latest Ref Rng & Units 02/07/2020   Cholestrol 0 - 200 mg/dL 409   LDLCALC 0 - 99 mg/dL 33   HDL >81 mg/dL 42   Trlycerides <191 mg/dL 478(G)   Hemoglobin N5A 4.8 - 5.6 % 8.1(H)   TCO2 22 - 32 mmol/L 20(L)      Capillary Blood Glucose: Lab Results  Component Value Date   GLUCAP 185 (H) 04/04/2020   GLUCAP 234 (H) 04/04/2020     Exercise Target Goals: Exercise Program Goal: Individual exercise prescription set using results from initial  6 min walk test and THRR while considering  patient's activity barriers and safety.   Exercise Prescription Goal: Starting with aerobic activity 30 plus minutes a day, 3 days per week for initial exercise prescription. Provide home exercise prescription and guidelines that participant acknowledges understanding prior to discharge.  Activity Barriers & Risk Stratification:  Activity Barriers & Cardiac Risk Stratification - 03/31/20 1059      Activity Barriers & Cardiac Risk Stratification   Activity Barriers Back Problems;Arthritis;Left Knee Replacement;Neck/Spine Problems;Chest Pain/Angina;Decreased Ventricular Function    Cardiac Risk Stratification  High           6 Minute Walk:  6 Minute Walk    Row Name 03/31/20 0833         6 Minute Walk   Phase Initial     Distance 1649 feet     Walk Time 6 minutes     # of Rest Breaks 0     MPH 3.12     METS 3.24     RPE 6     Perceived Dyspnea  0     VO2 Peak 11.35     Symptoms No     Resting HR 68 bpm     Resting BP 114/72     Resting Oxygen Saturation  96 %     Exercise Oxygen Saturation  during 6 min walk 96 %     Max Ex. HR 88 bpm     Max Ex. BP 126/76     2 Minute Post BP 110/70            Oxygen Initial Assessment:   Oxygen Re-Evaluation:   Oxygen Discharge (Final Oxygen Re-Evaluation):   Initial Exercise Prescription:  Initial Exercise Prescription - 03/31/20 1100      Date of Initial Exercise RX and Referring Provider   Date 03/31/20    Referring Provider Yates DecampJay Ganji, MD    Expected Discharge Date 05/27/20      NuStep   Level 3    SPM 85    Minutes 15    METs 2.4      Track   Laps 15    Minutes 15    METs 2.74      Prescription Details   Frequency (times per week) 3    Duration Progress to 30 minutes of continuous aerobic without signs/symptoms of physical distress      Intensity   THRR 40-80% of Max Heartrate 62-125    Ratings of Perceived Exertion 11-13    Perceived Dyspnea 0-4      Progression   Progression Continue progressive overload as per policy without signs/symptoms or physical distress.      Resistance Training   Training Prescription Yes    Weight 5 lbs    Reps 10-15           Perform Capillary Blood Glucose checks as needed.  Exercise Prescription Changes:   Exercise Prescription Changes    Row Name 04/04/20 1000 04/19/20 0800 05/02/20 1100         Response to Exercise   Blood Pressure (Admit) 96/58 115/78 122/84     Blood Pressure (Exercise) 124/82 122/80 144/90     Blood Pressure (Exit) 102/60 104/72 106/70     Heart Rate (Admit) 68 bpm 74 bpm 75 bpm     Heart Rate (Exercise) 94 bpm 96 bpm 91 bpm      Heart Rate (Exit) 78 bpm 73 bpm 83 bpm     Rating of  Perceived Exertion (Exercise) 12 12 12      Symptoms None None None     Comments Pt's first day of exercise in the CRP2 program. Reviewed Home exercise Rx Reviewed METs/Goals     Duration Progress to 30 minutes of  aerobic without signs/symptoms of physical distress Continue with 30 min of aerobic exercise without signs/symptoms of physical distress. Continue with 30 min of aerobic exercise without signs/symptoms of physical distress.     Intensity THRR unchanged THRR unchanged THRR unchanged           Progression   Progression Continue to progress workloads to maintain intensity without signs/symptoms of physical distress. Continue to progress workloads to maintain intensity without signs/symptoms of physical distress. Continue to progress workloads to maintain intensity without signs/symptoms of physical distress.     Average METs 2.5 2.8 3           Resistance Training   Training Prescription Yes Yes Yes     Weight 5 lbs 5 lbs 6 lbs     Reps 10-15 10-15 10-15     Time -- 10 Minutes 10 Minutes           Interval Training   Interval Training No No No           NuStep   Level 3 3 4      SPM 85 85 85     Minutes 15 15 15      METs 2.2 2.1 2.6           Track   Laps 15 15 15      Minutes 16 21 15      METs 2.86 3.44 3.44           Home Exercise Plan   Plans to continue exercise at -- Home (comment) Home (comment)     Frequency -- Add 2 additional days to program exercise sessions. Add 2 additional days to program exercise sessions.     Initial Home Exercises Provided -- 04/18/20 04/18/20            Exercise Comments:   Exercise Comments    Row Name 04/04/20 1038 04/18/20 1030 05/02/20 1135       Exercise Comments Pt's first day of exercise in the CRP2 program. Tolerated session well with no complaints. Reviewed home exercise Rx with patient. Pt verbalized understanding of home exercise Rx and was provided a copy.  Reviewed goals with patient. He is making good progress and voices feeling better since Dr. 04/20/20 increaased his Coreg dose. PT will continue to walk on his off days from the CRP2 program.            Exercise Goals and Review:   Exercise Goals    Row Name 03/31/20 1108             Exercise Goals   Increase Physical Activity Yes       Intervention Provide advice, education, support and counseling about physical activity/exercise needs.;Develop an individualized exercise prescription for aerobic and resistive training based on initial evaluation findings, risk stratification, comorbidities and participant's personal goals.       Expected Outcomes Short Term: Attend rehab on a regular basis to increase amount of physical activity.;Long Term: Add in home exercise to make exercise part of routine and to increase amount of physical activity.;Long Term: Exercising regularly at least 3-5 days a week.       Increase Strength and Stamina Yes       Intervention Provide advice,  education, support and counseling about physical activity/exercise needs.;Develop an individualized exercise prescription for aerobic and resistive training based on initial evaluation findings, risk stratification, comorbidities and participant's personal goals.       Expected Outcomes Short Term: Increase workloads from initial exercise prescription for resistance, speed, and METs.;Short Term: Perform resistance training exercises routinely during rehab and add in resistance training at home;Long Term: Improve cardiorespiratory fitness, muscular endurance and strength as measured by increased METs and functional capacity ( )       Able to understand and use rate of perceived exertion (RPE) scale Yes       Intervention Provide education and explanation on how to use RPE scale       Expected Outcomes Short Term: Able to use RPE daily in rehab to express subjective intensity level;Long Term:  Able to use RPE to guide intensity  level when exercising independently       Knowledge and understanding of Target Heart Rate Range (THRR) Yes       Intervention Provide education and explanation of THRR including how the numbers were predicted and where they are located for reference       Expected Outcomes Short Term: Able to state/look up THRR;Short Term: Able to use daily as guideline for intensity in rehab;Long Term: Able to use THRR to govern intensity when exercising independently       Understanding of Exercise Prescription Yes       Intervention Provide education, explanation, and written materials on patient's individual exercise prescription       Expected Outcomes Short Term: Able to explain program exercise prescription;Long Term: Able to explain home exercise prescription to exercise independently              Exercise Goals Re-Evaluation :  Exercise Goals Re-Evaluation    Row Name 04/04/20 1037 04/18/20 1000 04/19/20 0824 05/02/20 1133       Exercise Goal Re-Evaluation   Exercise Goals Review Increase Physical Activity;Increase Strength and Stamina;Able to understand and use rate of perceived exertion (RPE) scale;Knowledge and understanding of Target Heart Rate Range (THRR);Understanding of Exercise Prescription Increase Physical Activity;Increase Strength and Stamina;Able to understand and use rate of perceived exertion (RPE) scale;Knowledge and understanding of Target Heart Rate Range (THRR);Able to check pulse independently;Understanding of Exercise Prescription -- Increase Physical Activity;Increase Strength and Stamina;Able to understand and use rate of perceived exertion (RPE) scale;Knowledge and understanding of Target Heart Rate Range (THRR);Able to check pulse independently;Understanding of Exercise Prescription    Comments Pt's first day of exercise in the CRP2 program. Pt understands the RPE scale, THRR, and Exercise Rx. Reviewed home exercise Rx with patient. Pt will be walking 2-3x/week to increase his  CV fitness. Pt has a treadmill that he will be using as well. -- Reviewed Goals with patient. Pt voices making good progress toward his goals. Pt is feeling stonger and is walking on his off days from the CRP2 program 30-60 minutes.    Expected Outcomes Will continue to monitor patient and progress exercise workloads as tolerated. Pt will exercise at home on his own 2-3x/week for 30-45 minutes. -- Pt will exercise at home on his own 2-4 x/week for 30-45 minutes.            Discharge Exercise Prescription (Final Exercise Prescription Changes):  Exercise Prescription Changes - 05/02/20 1100      Response to Exercise   Blood Pressure (Admit) 122/84    Blood Pressure (Exercise) 144/90    Blood Pressure (Exit) 106/70  Heart Rate (Admit) 75 bpm    Heart Rate (Exercise) 91 bpm    Heart Rate (Exit) 83 bpm    Rating of Perceived Exertion (Exercise) 12    Symptoms None    Comments Reviewed METs/Goals    Duration Continue with 30 min of aerobic exercise without signs/symptoms of physical distress.    Intensity THRR unchanged      Progression   Progression Continue to progress workloads to maintain intensity without signs/symptoms of physical distress.    Average METs 3      Resistance Training   Training Prescription Yes    Weight 6 lbs    Reps 10-15    Time 10 Minutes      Interval Training   Interval Training No      NuStep   Level 4    SPM 85    Minutes 15    METs 2.6      Track   Laps 15    Minutes 15    METs 3.44      Home Exercise Plan   Plans to continue exercise at Home (comment)    Frequency Add 2 additional days to program exercise sessions.    Initial Home Exercises Provided 04/18/20           Nutrition:  Target Goals: Understanding of nutrition guidelines, daily intake of sodium 1500mg , cholesterol 200mg , calories 30% from fat and 7% or less from saturated fats, daily to have 5 or more servings of fruits and vegetables.  Biometrics:   Post Biometrics  - 03/31/20 0800       Post  Biometrics   Waist Circumference 48 inches    Hip Circumference 47.5 inches    Waist to Hip Ratio 1.01 %    Triceps Skinfold 12 mm    % Body Fat 33 %    Grip Strength 46 kg    Flexibility 11 in    Single Leg Stand 16.25 seconds           Nutrition Therapy Plan and Nutrition Goals:  Nutrition Therapy & Goals - 04/14/20 0843      Nutrition Therapy   Diet TLC; carb modified    Drug/Food Interactions Statins/Certain Fruits      Personal Nutrition Goals   Nutrition Goal Pt to learn diabetes meal planning (carb counting, insulin to carb ratio, and correction factors).    Personal Goal #2 Pt to identify food quantities necessary to achieve weight loss of 6-24 lb at graduation from cardiac rehab.    Personal Goal #3 Pt to build a healthy plate including vegetables, fruits, whole grains, and low-fat dairy products in a heart healthy meal plan.    Personal Goal #4 Maintain glycemic target of TIR >70%      Intervention Plan   Intervention Prescribe, educate and counsel regarding individualized specific dietary modifications aiming towards targeted core components such as weight, hypertension, lipid management, diabetes, heart failure and other comorbidities.;Nutrition handout(s) given to patient.    Expected Outcomes Long Term Goal: Adherence to prescribed nutrition plan.;Short Term Goal: A plan has been developed with personal nutrition goals set during dietitian appointment.           Nutrition Assessments:  MEDIFICTS Score Key:  ?70 Need to make dietary changes   40-70 Heart Healthy Diet  ? 40 Therapeutic Level Cholesterol Diet  Flowsheet Row CARDIAC REHAB PHASE II EXERCISE from 04/13/2020 in [facility] CARDIAC REHAB  Picture Your Plate Total Score on Admission  64     Picture Your Plate Scores:  <27 Unhealthy dietary pattern with much room for improvement.  41-50 Dietary pattern unlikely to meet recommendations for good  health and room for improvement.  51-60 More healthful dietary pattern, with some room for improvement.   >60 Healthy dietary pattern, although there may be some specific behaviors that could be improved.    Nutrition Goals Re-Evaluation:  Nutrition Goals Re-Evaluation    Row Name 04/14/20 0844             Goals   Current Weight 238 lb (108 kg)              Nutrition Goals Discharge (Final Nutrition Goals Re-Evaluation):  Nutrition Goals Re-Evaluation - 04/14/20 0844      Goals   Current Weight 238 lb (108 kg)           Psychosocial: Target Goals: Acknowledge presence or absence of significant depression and/or stress, maximize coping skills, provide positive support system. Participant is able to verbalize types and ability to use techniques and skills needed for reducing stress and depression.  Initial Review & Psychosocial Screening:  Initial Psych Review & Screening - 03/31/20 1146      Initial Review   Current issues with None Identified      Family Dynamics   Good Support System? Yes   Thedore has his wife for support     Barriers   Psychosocial barriers to participate in program There are no identifiable barriers or psychosocial needs.      Screening Interventions   Interventions Encouraged to exercise           Quality of Life Scores:  Quality of Life - 03/31/20 1050      Quality of Life   Select Quality of Life      Quality of Life Scores   Health/Function Pre 19.57 %    Socioeconomic Pre 30 %    Psych/Spiritual Pre 20.21 %    Family Pre 24.8 %    GLOBAL Pre 22.83 %          Scores of 19 and below usually indicate a poorer quality of life in these areas.  A difference of  2-3 points is a clinically meaningful difference.  A difference of 2-3 points in the total score of the Quality of Life Index has been associated with significant improvement in overall quality of life, self-image, physical symptoms, and general health in studies assessing  change in quality of life.  PHQ-9: Recent Review Flowsheet Data    Depression screen [facility] 2/9 03/31/2020   Decreased Interest 0   Down, Depressed, Hopeless 0   PHQ - 2 Score 0   Altered sleeping 0   Tired, decreased energy 1   Change in appetite 0   Feeling bad or failure about yourself  0   Trouble concentrating 0   Moving slowly or fidgety/restless 0   Suicidal thoughts 0   PHQ-9 Score 1   Difficult doing work/chores Not difficult at all     Interpretation of Total Score  Total Score Depression Severity:  1-4 = Minimal depression, 5-9 = Mild depression, 10-14 = Moderate depression, 15-19 = Moderately severe depression, 20-27 = Severe depression   Psychosocial Evaluation and Intervention:   Psychosocial Re-Evaluation:  Psychosocial Re-Evaluation    Row Name 04/05/20 0933 04/29/20 1026           Psychosocial Re-Evaluation   Current issues with Current Stress Concerns Current Stress Concerns  Comments To Review quality of life questionairre the week of 04/04/20 Savyon did have some healht concerns regarding chest pressure. No other concerns have been voiced      Expected Outcomes -- Lucciano will have decreased stressors upon completion of phase 2 cardiac rehab.      Interventions Encouraged to attend Cardiac Rehabilitation for the exercise;Encouraged to attend Pulmonary Rehabilitation for the exercise Encouraged to attend Cardiac Rehabilitation for the exercise;Encouraged to attend Pulmonary Rehabilitation for the exercise      Continue Psychosocial Services  Follow up required by staff Follow up required by staff             Initial Review   Source of Stress Concerns Family Chronic Illness             Psychosocial Discharge (Final Psychosocial Re-Evaluation):  Psychosocial Re-Evaluation - 04/29/20 1026      Psychosocial Re-Evaluation   Current issues with Current Stress Concerns    Comments Jac did have some healht concerns regarding chest pressure. No other concerns  have been voiced    Expected Outcomes Luvern will have decreased stressors upon completion of phase 2 cardiac rehab.    Interventions Encouraged to attend Cardiac Rehabilitation for the exercise;Encouraged to attend Pulmonary Rehabilitation for the exercise    Continue Psychosocial Services  Follow up required by staff      Initial Review   Source of Stress Concerns Chronic Illness           Vocational Rehabilitation: Provide vocational rehab assistance to qualifying candidates.   Vocational Rehab Evaluation & Intervention:  Vocational Rehab - 03/31/20 1147      Initial Vocational Rehab Evaluation & Intervention   Assessment shows need for Vocational Rehabilitation No           Education: Education Goals: Education classes will be provided on a weekly basis, covering required topics. Participant will state understanding/return demonstration of topics presented.  Learning Barriers/Preferences:  Learning Barriers/Preferences - 03/31/20 1051      Learning Barriers/Preferences   Learning Barriers None    Learning Preferences Audio;Computer/Internet;Pictoral;Video;Verbal Instruction           Education Topics: Hypertension, Hypertension Reduction -Define heart disease and high blood pressure. Discus how high blood pressure affects the body and ways to reduce high blood pressure.   Exercise and Your Heart -Discuss why it is important to exercise, the FITT principles of exercise, normal and abnormal responses to exercise, and how to exercise safely.   Angina -Discuss definition of angina, causes of angina, treatment of angina, and how to decrease risk of having angina.   Cardiac Medications -Review what the following cardiac medications are used for, how they affect the body, and side effects that may occur when taking the medications.  Medications include Aspirin, Beta blockers, calcium channel blockers, ACE Inhibitors, angiotensin receptor blockers, diuretics, digoxin,  and antihyperlipidemics.   Congestive Heart Failure -Discuss the definition of CHF, how to live with CHF, the signs and symptoms of CHF, and how keep track of weight and sodium intake.   Heart Disease and Intimacy -Discus the effect sexual activity has on the heart, how changes occur during intimacy as we age, and safety during sexual activity.   Smoking Cessation / COPD -Discuss different methods to quit smoking, the health benefits of quitting smoking, and the definition of COPD.   Nutrition I: Fats -Discuss the types of cholesterol, what cholesterol does to the heart, and how cholesterol levels can be controlled.   Nutrition II:  Labels -Discuss the different components of food labels and how to read food label   Heart Parts/Heart Disease and PAD -Discuss the anatomy of the heart, the pathway of blood circulation through the heart, and these are affected by heart disease.   Stress I: Signs and Symptoms -Discuss the causes of stress, how stress may lead to anxiety and depression, and ways to limit stress.   Stress II: Relaxation -Discuss different types of relaxation techniques to limit stress.   Warning Signs of Stroke / TIA -Discuss definition of a stroke, what the signs and symptoms are of a stroke, and how to identify when someone is having stroke.   Knowledge Questionnaire Score:  Knowledge Questionnaire Score - 03/31/20 1052      Knowledge Questionnaire Score   Pre Score 19/24           Core Components/Risk Factors/Patient Goals at Admission:  Personal Goals and Risk Factors at Admission - 03/31/20 1055      Core Components/Risk Factors/Patient Goals on Admission    Weight Management Yes;Obesity;Weight Loss    Intervention Weight Management: Develop a combined nutrition and exercise program designed to reach desired caloric intake, while maintaining appropriate intake of nutrient and fiber, sodium and fats, and appropriate energy expenditure required for  the weight goal.;Weight Management: Provide education and appropriate resources to help participant work on and attain dietary goals.;Weight Management/Obesity: Establish reasonable short term and long term weight goals.;Obesity: Provide education and appropriate resources to help participant work on and attain dietary goals.    Admit Weight 238 lb 15.7 oz (108.4 kg)    Expected Outcomes Short Term: Continue to assess and modify interventions until short term weight is achieved;Long Term: Adherence to nutrition and physical activity/exercise program aimed toward attainment of established weight goal;Weight Maintenance: Understanding of the daily nutrition guidelines, which includes 25-35% calories from fat, 7% or less cal from saturated fats, less than 200mg  cholesterol, less than 1.5gm of sodium, & 5 or more servings of fruits and vegetables daily;Weight Loss: Understanding of general recommendations for a balanced deficit meal plan, which promotes 1-2 lb weight loss per week and includes a negative energy balance of [phone] kcal/d;Understanding recommendations for meals to include 15-35% energy as protein, 25-35% energy from fat, 35-60% energy from carbohydrates, less than 200mg  of dietary cholesterol, 20-35 gm of total fiber daily;Understanding of distribution of calorie intake throughout the day with the consumption of 4-5 meals/snacks    Diabetes Yes    Intervention Provide education about signs/symptoms and action to take for hypo/hyperglycemia.;Provide education about proper nutrition, including hydration, and aerobic/resistive exercise prescription along with prescribed medications to achieve blood glucose in normal ranges: Fasting glucose 65-99 mg/dL    Expected Outcomes Short Term: Participant verbalizes understanding of the signs/symptoms and immediate care of hyper/hypoglycemia, proper foot care and importance of medication, aerobic/resistive exercise and nutrition plan for blood glucose  control.;Long Term: Attainment of HbA1C < 7%.    Hypertension Yes    Intervention Provide education on lifestyle modifcations including regular physical activity/exercise, weight management, moderate sodium restriction and increased consumption of fresh fruit, vegetables, and low fat dairy, alcohol moderation, and smoking cessation.;Monitor prescription use compliance.    Expected Outcomes Short Term: Continued assessment and intervention until BP is < 140/21mm HG in hypertensive participants. < 130/16mm HG in hypertensive participants with diabetes, heart failure or chronic kidney disease.;Long Term: Maintenance of blood pressure at goal levels.    Lipids Yes    Intervention Provide education and support for participant  on nutrition & aerobic/resistive exercise along with prescribed medications to achieve LDL 70mg , HDL >40mg .    Expected Outcomes Short Term: Participant states understanding of desired cholesterol values and is compliant with medications prescribed. Participant is following exercise prescription and nutrition guidelines.;Long Term: Cholesterol controlled with medications as prescribed, with individualized exercise RX and with personalized nutrition plan. Value goals: LDL < 70mg , HDL > 40 mg.           Core Components/Risk Factors/Patient Goals Review:   Goals and Risk Factor Review    Row Name 04/05/20 1610 04/29/20 1028           Core Components/Risk Factors/Patient Goals Review   Personal Goals Review Weight Management/Obesity;Diabetes;Hypertension;Lipids Weight Management/Obesity;Diabetes;Hypertension;Lipids      Review Vallen started exercise on 04/04/20. Rowyn did well with exercise vital sign and CBG's stable. Zalen's vital signs and CBG's have been stable at cardiac rehab. Dr Jacinto Halim increased his coreg will continue to monitor for symptoms      Expected Outcomes Gianni will continue to participate in phase 2 cardiac rehab for exercise nutrtion and lifestyle modifications Muhamed  will continue to participate in phase 2 cardiac rehab for exercise nutrition and lifestyle modifications             Core Components/Risk Factors/Patient Goals at Discharge (Final Review):   Goals and Risk Factor Review - 04/29/20 1028      Core Components/Risk Factors/Patient Goals Review   Personal Goals Review Weight Management/Obesity;Diabetes;Hypertension;Lipids    Review Jerrald's vital signs and CBG's have been stable at cardiac rehab. Dr Jacinto Halim increased his coreg will continue to monitor for symptoms    Expected Outcomes Johnmichael will continue to participate in phase 2 cardiac rehab for exercise nutrition and lifestyle modifications           ITP Comments:  ITP Comments    Row Name 03/31/20 [id number] 04/05/20 0929 04/29/20 1022       ITP Comments Dr Armanda Magic MD, Medical Director 30 Day ITP Review. Yunior started exercise on 04/04/20. Patient did well with exercise. Vital sings stable 30 Day ITP Review. Odis has good attendance and participation in phase 2 cardiac rehab            Comments: See ITP comments. Hiep reports having less discomfort since his coreg dose was increased.Gladstone Lighter, RN,BSN 05/03/2020 1:10 PM

## 2020-05-04 ENCOUNTER — Encounter (HOSPITAL_COMMUNITY)
Admission: RE | Admit: 2020-05-04 | Discharge: 2020-05-04 | Disposition: A | Discharge status: Home / Self Care | Payer: Medicare Other | Source: Ambulatory Visit | Attending: Cardiology | Admitting: Cardiology

## 2020-05-04 ENCOUNTER — Other Ambulatory Visit: Payer: Self-pay

## 2020-05-04 DIAGNOSIS — Z955 Presence of coronary angioplasty implant and graft: Secondary | ICD-10-CM

## 2020-05-04 DIAGNOSIS — I2102 ST elevation (STEMI) myocardial infarction involving left anterior descending coronary artery: Secondary | ICD-10-CM

## 2020-05-06 ENCOUNTER — Other Ambulatory Visit: Payer: Self-pay

## 2020-05-06 ENCOUNTER — Encounter (HOSPITAL_COMMUNITY)
Admission: RE | Admit: 2020-05-06 | Discharge: 2020-05-06 | Disposition: A | Discharge status: Home / Self Care | Payer: Medicare Other | Source: Ambulatory Visit | Attending: Cardiology | Admitting: Cardiology

## 2020-05-06 DIAGNOSIS — Z955 Presence of coronary angioplasty implant and graft: Secondary | ICD-10-CM

## 2020-05-06 DIAGNOSIS — I2102 ST elevation (STEMI) myocardial infarction involving left anterior descending coronary artery: Secondary | ICD-10-CM

## 2020-05-06 NOTE — Progress Notes (Signed)
QUALITY OF LIFE SCORE REVIEW  Cory Sosa completed Quality of Life survey as a participant in Cardiac Rehab. Scores 21.0 or below are considered low. Pt scored low in psychosocial and health and functioning Overall 22.83, Health and Function 19.57, socioeconomic 30, physiological and spiritual 20.21, family 24.80. Patient quality of life slightly altered by physical constraints which limits ability to perform as prior to recent cardiac illness. Cory Sosa reports feeling better as far as health and functioning is concerned. Cory Sosa is not having as much chest discomfort since his coreg has been increased.Cory Sosa emotional support and reassurance.  Will continue to monitor and intervene as necessary. Cory Lighter, RN,BSN 05/06/2020 10:42 AM

## 2020-05-09 ENCOUNTER — Encounter (HOSPITAL_COMMUNITY)
Admission: RE | Admit: 2020-05-09 | Discharge: 2020-05-09 | Disposition: A | Discharge status: Home / Self Care | Payer: Medicare Other | Source: Ambulatory Visit | Attending: Cardiology | Admitting: Cardiology

## 2020-05-09 ENCOUNTER — Other Ambulatory Visit: Payer: Self-pay

## 2020-05-09 DIAGNOSIS — I2102 ST elevation (STEMI) myocardial infarction involving left anterior descending coronary artery: Secondary | ICD-10-CM

## 2020-05-09 DIAGNOSIS — Z955 Presence of coronary angioplasty implant and graft: Secondary | ICD-10-CM

## 2020-05-10 ENCOUNTER — Ambulatory Visit: Payer: Medicare Other

## 2020-05-10 DIAGNOSIS — I2510 Atherosclerotic heart disease of native coronary artery without angina pectoris: Secondary | ICD-10-CM

## 2020-05-10 DIAGNOSIS — I5022 Chronic systolic (congestive) heart failure: Secondary | ICD-10-CM

## 2020-05-11 ENCOUNTER — Encounter (HOSPITAL_COMMUNITY)
Admission: RE | Admit: 2020-05-11 | Discharge: 2020-05-11 | Disposition: A | Discharge status: Home / Self Care | Payer: Medicare Other | Source: Ambulatory Visit | Attending: Cardiology | Admitting: Cardiology

## 2020-05-11 ENCOUNTER — Other Ambulatory Visit: Payer: Self-pay

## 2020-05-11 DIAGNOSIS — I2102 ST elevation (STEMI) myocardial infarction involving left anterior descending coronary artery: Secondary | ICD-10-CM

## 2020-05-11 DIAGNOSIS — Z955 Presence of coronary angioplasty implant and graft: Secondary | ICD-10-CM | POA: Diagnosis not present

## 2020-05-11 NOTE — Progress Notes (Signed)
Continuous glucose monitoring download:      Average is   152mg /dl  for 14 days   Time sensor is active   89 %   Time in range (70-180 mg/dL):  83 % (Goal )   Time High (181-250 mg/dL)  15 % (Goal < >37%)   Time Very High (>250 mg/dl)  2 % (Goal < 5%)   Time Low (54-69 mg/dL)  0 % (Goal is 29%)   Time Very Low (<54)  0%  (Goal <1%)   Coefficient of variation  23.1% (Goal is <36%)      Pt said he had labwork done last week and reports A1C 8.0 Overall numbers are in range. More variability midday which pt identifies as delayed insulin injection (takes it sometime after meal) or forgetting to take insulin. He has realized this is because he was not keeping it in his kitchen so he has started keeping insulin in kitchen.  Will continue to monitor during cardiac rehab.  <0%, MS, RDN, LDN

## 2020-05-13 ENCOUNTER — Other Ambulatory Visit: Payer: Self-pay

## 2020-05-13 ENCOUNTER — Encounter (HOSPITAL_COMMUNITY)
Admission: RE | Admit: 2020-05-13 | Discharge: 2020-05-13 | Disposition: A | Discharge status: Home / Self Care | Payer: Medicare Other | Source: Ambulatory Visit | Attending: Cardiology | Admitting: Cardiology

## 2020-05-13 DIAGNOSIS — I2102 ST elevation (STEMI) myocardial infarction involving left anterior descending coronary artery: Secondary | ICD-10-CM

## 2020-05-13 DIAGNOSIS — Z955 Presence of coronary angioplasty implant and graft: Secondary | ICD-10-CM | POA: Diagnosis not present

## 2020-05-13 NOTE — Progress Notes (Signed)
Called and spoke with pt regarding echocardiogram results. Pt voiced understanding.

## 2020-05-13 NOTE — Progress Notes (Signed)
Please inform patient about activity of heart has improved to 55-60%.  Otherwise normal echo.

## 2020-05-16 ENCOUNTER — Encounter (HOSPITAL_COMMUNITY)
Admission: RE | Admit: 2020-05-16 | Discharge: 2020-05-16 | Disposition: A | Discharge status: Home / Self Care | Payer: Medicare Other | Source: Ambulatory Visit | Attending: Cardiology | Admitting: Cardiology

## 2020-05-16 ENCOUNTER — Other Ambulatory Visit: Payer: Self-pay

## 2020-05-16 DIAGNOSIS — I2102 ST elevation (STEMI) myocardial infarction involving left anterior descending coronary artery: Secondary | ICD-10-CM

## 2020-05-16 DIAGNOSIS — Z955 Presence of coronary angioplasty implant and graft: Secondary | ICD-10-CM

## 2020-05-18 ENCOUNTER — Other Ambulatory Visit: Payer: Self-pay

## 2020-05-18 ENCOUNTER — Encounter (HOSPITAL_COMMUNITY)
Admission: RE | Admit: 2020-05-18 | Discharge: 2020-05-18 | Disposition: A | Discharge status: Home / Self Care | Payer: Medicare Other | Source: Ambulatory Visit | Attending: Cardiology | Admitting: Cardiology

## 2020-05-18 DIAGNOSIS — Z955 Presence of coronary angioplasty implant and graft: Secondary | ICD-10-CM | POA: Diagnosis not present

## 2020-05-18 DIAGNOSIS — I2102 ST elevation (STEMI) myocardial infarction involving left anterior descending coronary artery: Secondary | ICD-10-CM

## 2020-05-20 ENCOUNTER — Other Ambulatory Visit: Payer: Self-pay

## 2020-05-20 ENCOUNTER — Encounter (HOSPITAL_COMMUNITY)
Admission: RE | Admit: 2020-05-20 | Discharge: 2020-05-20 | Disposition: A | Discharge status: Home / Self Care | Payer: Medicare Other | Source: Ambulatory Visit | Attending: Cardiology | Admitting: Cardiology

## 2020-05-20 DIAGNOSIS — I2102 ST elevation (STEMI) myocardial infarction involving left anterior descending coronary artery: Secondary | ICD-10-CM

## 2020-05-20 DIAGNOSIS — Z955 Presence of coronary angioplasty implant and graft: Secondary | ICD-10-CM | POA: Diagnosis not present

## 2020-05-23 ENCOUNTER — Other Ambulatory Visit: Payer: Self-pay

## 2020-05-23 ENCOUNTER — Encounter (HOSPITAL_COMMUNITY)
Admission: RE | Admit: 2020-05-23 | Discharge: 2020-05-23 | Disposition: A | Discharge status: Home / Self Care | Payer: Medicare Other | Source: Ambulatory Visit | Attending: Cardiology | Admitting: Cardiology

## 2020-05-23 DIAGNOSIS — I2102 ST elevation (STEMI) myocardial infarction involving left anterior descending coronary artery: Secondary | ICD-10-CM

## 2020-05-23 DIAGNOSIS — Z955 Presence of coronary angioplasty implant and graft: Secondary | ICD-10-CM | POA: Diagnosis not present

## 2020-05-24 NOTE — Progress Notes (Signed)
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  aspirin 81 MG chewable tablet, Chew 1 tablet (81 mg total) by mouth daily., Disp: , Rfl:  .  Canagliflozin-metFORMIN HCl (INVOKAMET PO), Take 1,000 mg by mouth 2 (two) times daily. 50-1000, Disp: , Rfl:  .  carvedilol (COREG) 6.25 MG tablet, Take 2 tablets (12.5 mg total) by mouth 2 (two) times daily., Disp: 180 tablet, Rfl: 3 .  ergocalciferol (VITAMIN D2) 1.25 MG (50000 UT) capsule, Take 50,000 Units by mouth once a week. Sunday, Disp: , Rfl:  .  insulin aspart (NOVOLOG FLEXPEN) 100 UNIT/ML FlexPen, Inject 24 Units into the skin 3 (three) times daily with meals. And 10 units with a snack, Disp: , Rfl:  .  insulin glargine, 2 Unit Dial, (TOUJEO MAX SOLOSTAR) 300 UNIT/ML Solostar Pen, Inject 60 Units into the skin daily., Disp: , Rfl:  .  losartan (COZAAR) 25 MG tablet, Take 1 tablet (25 mg total) by mouth every evening., Disp: 90 tablet, Rfl: 3 .  Multiple Vitamin tablet, Take 1 tablet by mouth daily., Disp: , Rfl:  .  nitroGLYCERIN (NITROSTAT) 0.4 MG SL tablet, Place 1 tablet (0.4 mg total) under the tongue every 5 (five) minutes as needed for chest pain., Disp: 25 tablet, Rfl: 1 .  nystatin ointment (MYCOSTATIN), Apply 1 application topically 2 (two) times daily as needed. Balanitis, Disp: , Rfl:   .  pantoprazole (PROTONIX) 40 MG tablet, Take 40 mg by mouth daily., Disp: , Rfl:  .  rosuvastatin (CRESTOR) 20 MG tablet, Take 20 mg by mouth at bedtime., Disp: , Rfl:  .  ticagrelor (BRILINTA) 90 MG TABS tablet, Take 1 tablet (90 mg total) by mouth 2 (two) times daily., Disp: 180 tablet, Rfl: 3 .  zolpidem (AMBIEN) 10 MG tablet, Take 5 mg by mouth at bedtime as needed for sleep. , Disp: , Rfl: 2  Past Medical History: Past Medical History:  Diagnosis Date  . Coronary artery disease   . Diabetes mellitus without complication (HCC)   . Hyperlipidemia   . Hypertension    controlled with medications    Tobacco Use: Social History   Tobacco Use  Smoking Status Never Smoker  Smokeless Tobacco Never Used    Labs: Recent Review Advice worker    Labs for ITP Cardiac and Pulmonary Rehab Latest Ref Rng & Units 02/07/2020   Cholestrol 0 - 200 mg/dL 409   LDLCALC 0 - 99 mg/dL 33   HDL >81 mg/dL 42   Trlycerides <191 mg/dL 478(G)   Hemoglobin N5A 4.8 - 5.6 % 8.1(H)   TCO2 22 - 32 mmol/L 20(L)      Capillary Blood Glucose: Lab Results  Component Value Date   GLUCAP 185 (H) 04/04/2020   GLUCAP 234 (H) 04/04/2020     Exercise Target Goals: Exercise Program Goal: Individual exercise prescription set using results from initial  6 min walk test and THRR while considering  patient's activity barriers and safety.   Exercise Prescription Goal: Starting with aerobic activity 30 plus minutes a day, 3 days per week for initial exercise prescription. Provide home exercise prescription and guidelines that participant acknowledges understanding prior to discharge.  Activity Barriers & Risk Stratification:  Activity Barriers & Cardiac Risk Stratification - 03/31/20 1059      Activity Barriers & Cardiac Risk Stratification   Activity Barriers Back Problems;Arthritis;Left Knee Replacement;Neck/Spine Problems;Chest Pain/Angina;Decreased Ventricular Function    Cardiac Risk Stratification  High           6 Minute Walk:  6 Minute Walk    Row Name 03/31/20 0833 05/23/20 0850       6 Minute Walk   Phase Initial Discharge    Distance 1649 feet 1987 feet    Distance % Change - 20.5 %    Distance Feet Change - 338 ft    Walk Time 6 minutes 6 minutes    # of Rest Breaks 0 0    MPH 3.12 3.76    METS 3.24 4.03    RPE 6 12    Perceived Dyspnea  0 0    VO2 Peak 11.35 14.1    Symptoms No No    Resting HR 68 bpm 79 bpm    Resting BP 114/72 108/70    Resting Oxygen Saturation  96 % 99 %    Exercise Oxygen Saturation  during 6 min walk 96 % 98 %    Max Ex. HR 88 bpm 109 bpm    Max Ex. BP 126/76 128/70    2 Minute Post BP 110/70 -           Oxygen Initial Assessment:   Oxygen Re-Evaluation:   Oxygen Discharge (Final Oxygen Re-Evaluation):   Initial Exercise Prescription:  Initial Exercise Prescription - 03/31/20 1100      Date of Initial Exercise RX and Referring Provider   Date 03/31/20    Referring Provider Yates Decamp, MD    Expected Discharge Date 05/27/20      NuStep   Level 3    SPM 85    Minutes 15    METs 2.4      Track   Laps 15    Minutes 15    METs 2.74      Prescription Details   Frequency (times per week) 3    Duration Progress to 30 minutes of continuous aerobic without signs/symptoms of physical distress      Intensity   THRR 40-80% of Max Heartrate 62-125    Ratings of Perceived Exertion 11-13    Perceived Dyspnea 0-4      Progression   Progression Continue progressive overload as per policy without signs/symptoms or physical distress.      Resistance Training   Training Prescription Yes    Weight 5 lbs    Reps 10-15           Perform Capillary Blood Glucose checks as needed.  Exercise Prescription Changes:  Exercise Prescription Changes    Row Name 04/04/20 1000 04/19/20 0800 05/02/20 1100 05/20/20 1200       Response to Exercise   Blood Pressure (Admit) 96/58 115/78 122/84 110/70    Blood Pressure (Exercise)  124/82 122/80 144/90 122/74    Blood Pressure (Exit) 102/60 104/72 106/70 108/72    Heart Rate (Admit) 68 bpm 74 bpm 75 bpm 83 bpm    Heart Rate (Exercise) 94  bpm 96 bpm 91 bpm 99 bpm    Heart Rate (Exit) 78 bpm 73 bpm 83 bpm 81 bpm    Rating of Perceived Exertion (Exercise) 12 12 12 12     Symptoms None None None None    Comments Pt's first day of exercise in the CRP2 program. Reviewed Home exercise Rx Reviewed METs/Goals Reviewed METs    Duration Progress to 30 minutes of  aerobic without signs/symptoms of physical distress Continue with 30 min of aerobic exercise without signs/symptoms of physical distress. Continue with 30 min of aerobic exercise without signs/symptoms of physical distress. Continue with 30 min of aerobic exercise without signs/symptoms of physical distress.    Intensity THRR unchanged THRR unchanged THRR unchanged THRR unchanged         Progression   Progression Continue to progress workloads to maintain intensity without signs/symptoms of physical distress. Continue to progress workloads to maintain intensity without signs/symptoms of physical distress. Continue to progress workloads to maintain intensity without signs/symptoms of physical distress. Continue to progress workloads to maintain intensity without signs/symptoms of physical distress.    Average METs 2.5 2.8 3 3.2         Resistance Training   Training Prescription Yes Yes Yes Yes    Weight 5 lbs 5 lbs 6 lbs 6 lbs    Reps 10-15 10-15 10-15 10-15    Time - 10 Minutes 10 Minutes 10 Minutes         Interval Training   Interval Training No No No No         NuStep   Level 3 3 4 5     SPM 85 85 85 85    Minutes 15 15 15 15     METs 2.2 2.1 2.6 3         Track   Laps 15 15 15 21     Minutes 16 21 15 15     METs 2.86 3.44 3.44 3.44         Home Exercise Plan   Plans to continue exercise at - Home (comment) Home (comment) Home (comment)    Frequency - Add 2 additional days to program exercise sessions.  Add 2 additional days to program exercise sessions. Add 2 additional days to program exercise sessions.    Initial Home Exercises Provided - 04/18/20 04/18/20 04/18/20           Exercise Comments:  Exercise Comments    Row Name 04/04/20 1038 04/18/20 1030 05/02/20 1135 05/20/20 1200     Exercise Comments Pt's first day of exercise in the CRP2 program. Tolerated session well with no complaints. Reviewed home exercise Rx with patient. Pt verbalized understanding of home exercise Rx and was provided a copy. Reviewed goals with patient. He is making good progress and voices feeling better since Dr. 04/20/20 increaased his Coreg dose. PT will continue to walk on his off days from the CRP2 program. Reviewed METs. Pt continues to make good progress. Pt voices improve stamina and endurance. Finds his walks to be easier.           Exercise Goals and Review:  Exercise Goals    Row Name 03/31/20 1108             Exercise Goals   Increase Physical Activity Yes       Intervention Provide advice, education, support and counseling about physical activity/exercise needs.;Develop an individualized exercise prescription for aerobic and resistive training based on initial evaluation findings, risk stratification, comorbidities and participant's  personal goals.       Expected Outcomes Short Term: Attend rehab on a regular basis to increase amount of physical activity.;Long Term: Add in home exercise to make exercise part of routine and to increase amount of physical activity.;Long Term: Exercising regularly at least 3-5 days a week.       Increase Strength and Stamina Yes       Intervention Provide advice, education, support and counseling about physical activity/exercise needs.;Develop an individualized exercise prescription for aerobic and resistive training based on initial evaluation findings, risk stratification, comorbidities and participant's personal goals.       Expected Outcomes Short Term:  Increase workloads from initial exercise prescription for resistance, speed, and METs.;Short Term: Perform resistance training exercises routinely during rehab and add in resistance training at home;Long Term: Improve cardiorespiratory fitness, muscular endurance and strength as measured by increased METs and functional capacity ( )       Able to understand and use rate of perceived exertion (RPE) scale Yes       Intervention Provide education and explanation on how to use RPE scale       Expected Outcomes Short Term: Able to use RPE daily in rehab to express subjective intensity level;Long Term:  Able to use RPE to guide intensity level when exercising independently       Knowledge and understanding of Target Heart Rate Range (THRR) Yes       Intervention Provide education and explanation of THRR including how the numbers were predicted and where they are located for reference       Expected Outcomes Short Term: Able to state/look up THRR;Short Term: Able to use daily as guideline for intensity in rehab;Long Term: Able to use THRR to govern intensity when exercising independently       Understanding of Exercise Prescription Yes       Intervention Provide education, explanation, and written materials on patient's individual exercise prescription       Expected Outcomes Short Term: Able to explain program exercise prescription;Long Term: Able to explain home exercise prescription to exercise independently              Exercise Goals Re-Evaluation :  Exercise Goals Re-Evaluation    Row Name 04/04/20 1037 04/18/20 1000 04/19/20 0824 05/02/20 1133       Exercise Goal Re-Evaluation   Exercise Goals Review Increase Physical Activity;Increase Strength and Stamina;Able to understand and use rate of perceived exertion (RPE) scale;Knowledge and understanding of Target Heart Rate Range (THRR);Understanding of Exercise Prescription Increase Physical Activity;Increase Strength and Stamina;Able to  understand and use rate of perceived exertion (RPE) scale;Knowledge and understanding of Target Heart Rate Range (THRR);Able to check pulse independently;Understanding of Exercise Prescription - Increase Physical Activity;Increase Strength and Stamina;Able to understand and use rate of perceived exertion (RPE) scale;Knowledge and understanding of Target Heart Rate Range (THRR);Able to check pulse independently;Understanding of Exercise Prescription    Comments Pt's first day of exercise in the CRP2 program. Pt understands the RPE scale, THRR, and Exercise Rx. Reviewed home exercise Rx with patient. Pt will be walking 2-3x/week to increase his CV fitness. Pt has a treadmill that he will be using as well. - Reviewed Goals with patient. Pt voices making good progress toward his goals. Pt is feeling stonger and is walking on his off days from the CRP2 program 30-60 minutes.    Expected Outcomes Will continue to monitor patient and progress exercise workloads as tolerated. Pt will exercise at home on his own  2-3x/week for 30-45 minutes. - Pt will exercise at home on his own 2-4 x/week for 30-45 minutes.            Discharge Exercise Prescription (Final Exercise Prescription Changes):  Exercise Prescription Changes - 05/20/20 1200      Response to Exercise   Blood Pressure (Admit) 110/70    Blood Pressure (Exercise) 122/74    Blood Pressure (Exit) 108/72    Heart Rate (Admit) 83 bpm    Heart Rate (Exercise) 99 bpm    Heart Rate (Exit) 81 bpm    Rating of Perceived Exertion (Exercise) 12    Symptoms None    Comments Reviewed METs    Duration Continue with 30 min of aerobic exercise without signs/symptoms of physical distress.    Intensity THRR unchanged      Progression   Progression Continue to progress workloads to maintain intensity without signs/symptoms of physical distress.    Average METs 3.2      Resistance Training   Training Prescription Yes    Weight 6 lbs    Reps 10-15    Time  10 Minutes      Interval Training   Interval Training No      NuStep   Level 5    SPM 85    Minutes 15    METs 3      Track   Laps 21    Minutes 15    METs 3.44      Home Exercise Plan   Plans to continue exercise at Home (comment)    Frequency Add 2 additional days to program exercise sessions.    Initial Home Exercises Provided 04/18/20           Nutrition:  Target Goals: Understanding of nutrition guidelines, daily intake of sodium 1500mg , cholesterol 200mg , calories 30% from fat and 7% or less from saturated fats, daily to have 5 or more servings of fruits and vegetables.  Biometrics:   Post Biometrics - 03/31/20 0800       Post  Biometrics   Waist Circumference 48 inches    Hip Circumference 47.5 inches    Waist to Hip Ratio 1.01 %    Triceps Skinfold 12 mm    % Body Fat 33 %    Grip Strength 46 kg    Flexibility 11 in    Single Leg Stand 16.25 seconds           Nutrition Therapy Plan and Nutrition Goals:  Nutrition Therapy & Goals - 04/14/20 0843      Nutrition Therapy   Diet TLC; carb modified    Drug/Food Interactions Statins/Certain Fruits      Personal Nutrition Goals   Nutrition Goal Pt to learn diabetes meal planning (carb counting, insulin to carb ratio, and correction factors).    Personal Goal #2 Pt to identify food quantities necessary to achieve weight loss of 6-24 lb at graduation from cardiac rehab.    Personal Goal #3 Pt to build a healthy plate including vegetables, fruits, whole grains, and low-fat dairy products in a heart healthy meal plan.    Personal Goal #4 Maintain glycemic target of TIR >70%      Intervention Plan   Intervention Prescribe, educate and counsel regarding individualized specific dietary modifications aiming towards targeted core components such as weight, hypertension, lipid management, diabetes, heart failure and other comorbidities.;Nutrition handout(s) given to patient.    Expected Outcomes Long Term Goal:  Adherence to prescribed nutrition plan.;Short Term  Goal: A plan has been developed with personal nutrition goals set during dietitian appointment.           Nutrition Assessments:  MEDIFICTS Score Key:  ?70 Need to make dietary changes   40-70 Heart Healthy Diet  ? 40 Therapeutic Level Cholesterol Diet  Flowsheet Row CARDIAC REHAB PHASE [facility]  Picture Your Plate Total Score on Admission 64     Picture Your Plate Scores:  <16<40 Unhealthy dietary pattern with much room for improvement.  41-50 Dietary pattern unlikely to meet recommendations for good health and room for improvement.  51-60 More healthful dietary pattern, with some room for improvement.   >60 Healthy dietary pattern, although there may be some specific behaviors that could be improved.    Nutrition Goals Re-Evaluation:  Nutrition Goals Re-Evaluation    Row Name 04/14/20 0844 05/20/20 1151           Goals   Current Weight 238 lb (108 kg) 241 lb 2.9 oz (109.4 kg)      Nutrition Goal - Pt to learn diabetes meal planning (carb counting, insulin to carb ratio, and correction factors).      Comment - Weight gain 1.5 lbs             Personal Goal #2 Re-Evaluation   Personal Goal #2 - Pt to identify food quantities necessary to achieve weight loss of 6-24 lb at graduation from cardiac rehab.             Personal Goal #3 Re-Evaluation   Personal Goal #3 - Pt to build a healthy plate including vegetables, fruits, whole grains, and low-fat dairy products in a heart healthy meal plan.             Personal Goal #4 Re-Evaluation   Personal Goal #4 - Maintain glycemic target of TIR >70%             Nutrition Goals Discharge (Final Nutrition Goals Re-Evaluation):  Nutrition Goals Re-Evaluation - 05/20/20 1151      Goals   Current Weight 241 lb 2.9 oz (109.4 kg)    Nutrition Goal Pt to learn diabetes meal planning (carb counting, insulin to  carb ratio, and correction factors).    Comment Weight gain 1.5 lbs      Personal Goal #2 Re-Evaluation   Personal Goal #2 Pt to identify food quantities necessary to achieve weight loss of 6-24 lb at graduation from cardiac rehab.      Personal Goal #3 Re-Evaluation   Personal Goal #3 Pt to build a healthy plate including vegetables, fruits, whole grains, and low-fat dairy products in a heart healthy meal plan.      Personal Goal #4 Re-Evaluation   Personal Goal #4 Maintain glycemic target of TIR >70%           Psychosocial: Target Goals: Acknowledge presence or absence of significant depression and/or stress, maximize coping skills, provide positive support system. Participant is able to verbalize types and ability to use techniques and skills needed for reducing stress and depression.  Initial Review & Psychosocial Screening:  Initial Psych Review & Screening - 03/31/20 1146      Initial Review   Current issues with None Identified      Family Dynamics   Good Support System? Yes   Jonny RuizJohn has his wife for support     Barriers   Psychosocial barriers to participate in program There are no identifiable barriers  or psychosocial needs.      Screening Interventions   Interventions Encouraged to exercise           Quality of Life Scores:  Quality of Life - 03/31/20 1050      Quality of Life   Select Quality of Life      Quality of Life Scores   Health/Function Pre 19.57 %    Socioeconomic Pre 30 %    Psych/Spiritual Pre 20.21 %    Family Pre 24.8 %    GLOBAL Pre 22.83 %          Scores of 19 and below usually indicate a poorer quality of life in these areas.  A difference of  2-3 points is a clinically meaningful difference.  A difference of 2-3 points in the total score of the Quality of Life Index has been associated with significant improvement in overall quality of life, self-image, physical symptoms, and general health in studies assessing change in quality of  life.  PHQ-9: Recent Review Flowsheet Data    Depression screen [facility] 2/9 03/31/2020   Decreased Interest 0   Down, Depressed, Hopeless 0   PHQ - 2 Score 0   Altered sleeping 0   Tired, decreased energy 1   Change in appetite 0   Feeling bad or failure about yourself  0   Trouble concentrating 0   Moving slowly or fidgety/restless 0   Suicidal thoughts 0   PHQ-9 Score 1   Difficult doing work/chores Not difficult at all     Interpretation of Total Score  Total Score Depression Severity:  1-4 = Minimal depression, 5-9 = Mild depression, 10-14 = Moderate depression, 15-19 = Moderately severe depression, 20-27 = Severe depression   Psychosocial Evaluation and Intervention:   Psychosocial Re-Evaluation:  Psychosocial Re-Evaluation    Row Name 04/05/20 0933 04/29/20 1026 05/24/20 1535         Psychosocial Re-Evaluation   Current issues with Current Stress Concerns Current Stress Concerns Current Stress Concerns     Comments To Review quality of life questionairre the week of 04/04/20 Robt did have some healht concerns regarding chest pressure. No other concerns have been voiced Kahleb has not voiced any concerns or stressors on recent exercise sessions     Expected Outcomes - Torez will have decreased stressors upon completion of phase 2 cardiac rehab. Laval will have decreased stressors upon completion of phase 2 cardiac rehab.     Interventions Encouraged to attend Cardiac Rehabilitation for the exercise;Encouraged to attend Pulmonary Rehabilitation for the exercise Encouraged to attend Cardiac Rehabilitation for the exercise;Encouraged to attend Pulmonary Rehabilitation for the exercise Encouraged to attend Cardiac Rehabilitation for the exercise;Encouraged to attend Pulmonary Rehabilitation for the exercise     Continue Psychosocial Services  Follow up required by staff Follow up required by staff No Follow up required           Initial Review   Source of Stress Concerns Family Chronic  Illness Chronic Illness            Psychosocial Discharge (Final Psychosocial Re-Evaluation):  Psychosocial Re-Evaluation - 05/24/20 1535      Psychosocial Re-Evaluation   Current issues with Current Stress Concerns    Comments Cleston has not voiced any concerns or stressors on recent exercise sessions    Expected Outcomes Llewellyn will have decreased stressors upon completion of phase 2 cardiac rehab.    Interventions Encouraged to attend Cardiac Rehabilitation for the exercise;Encouraged to attend Pulmonary Rehabilitation  for the exercise    Continue Psychosocial Services  No Follow up required      Initial Review   Source of Stress Concerns Chronic Illness           Vocational Rehabilitation: Provide vocational rehab assistance to qualifying candidates.   Vocational Rehab Evaluation & Intervention:  Vocational Rehab - 03/31/20 1147      Initial Vocational Rehab Evaluation & Intervention   Assessment shows need for Vocational Rehabilitation No           Education: Education Goals: Education classes will be provided on a weekly basis, covering required topics. Participant will state understanding/return demonstration of topics presented.  Learning Barriers/Preferences:  Learning Barriers/Preferences - 03/31/20 1051      Learning Barriers/Preferences   Learning Barriers None    Learning Preferences Audio;Computer/Internet;Pictoral;Video;Verbal Instruction           Education Topics: Hypertension, Hypertension Reduction -Define heart disease and high blood pressure. Discus how high blood pressure affects the body and ways to reduce high blood pressure.   Exercise and Your Heart -Discuss why it is important to exercise, the FITT principles of exercise, normal and abnormal responses to exercise, and how to exercise safely.   Angina -Discuss definition of angina, causes of angina, treatment of angina, and how to decrease risk of having angina.   Cardiac  Medications -Review what the following cardiac medications are used for, how they affect the body, and side effects that may occur when taking the medications.  Medications include Aspirin, Beta blockers, calcium channel blockers, ACE Inhibitors, angiotensin receptor blockers, diuretics, digoxin, and antihyperlipidemics.   Congestive Heart Failure -Discuss the definition of CHF, how to live with CHF, the signs and symptoms of CHF, and how keep track of weight and sodium intake.   Heart Disease and Intimacy -Discus the effect sexual activity has on the heart, how changes occur during intimacy as we age, and safety during sexual activity.   Smoking Cessation / COPD -Discuss different methods to quit smoking, the health benefits of quitting smoking, and the definition of COPD.   Nutrition I: Fats -Discuss the types of cholesterol, what cholesterol does to the heart, and how cholesterol levels can be controlled.   Nutrition II: Labels -Discuss the different components of food labels and how to read food label   Heart Parts/Heart Disease and PAD -Discuss the anatomy of the heart, the pathway of blood circulation through the heart, and these are affected by heart disease.   Stress I: Signs and Symptoms -Discuss the causes of stress, how stress may lead to anxiety and depression, and ways to limit stress.   Stress II: Relaxation -Discuss different types of relaxation techniques to limit stress.   Warning Signs of Stroke / TIA -Discuss definition of a stroke, what the signs and symptoms are of a stroke, and how to identify when someone is having stroke.   Knowledge Questionnaire Score:  Knowledge Questionnaire Score - 03/31/20 1052      Knowledge Questionnaire Score   Pre Score 19/24           Core Components/Risk Factors/Patient Goals at Admission:  Personal Goals and Risk Factors at Admission - 03/31/20 1055      Core Components/Risk Factors/Patient Goals on Admission     Weight Management Yes;Obesity;Weight Loss    Intervention Weight Management: Develop a combined nutrition and exercise program designed to reach desired caloric intake, while maintaining appropriate intake of nutrient and fiber, sodium and fats, and appropriate energy  expenditure required for the weight goal.;Weight Management: Provide education and appropriate resources to help participant work on and attain dietary goals.;Weight Management/Obesity: Establish reasonable short term and long term weight goals.;Obesity: Provide education and appropriate resources to help participant work on and attain dietary goals.    Admit Weight 238 lb 15.7 oz (108.4 kg)    Expected Outcomes Short Term: Continue to assess and modify interventions until short term weight is achieved;Long Term: Adherence to nutrition and physical activity/exercise program aimed toward attainment of established weight goal;Weight Maintenance: Understanding of the daily nutrition guidelines, which includes 25-35% calories from fat, 7% or less cal from saturated fats, less than 200mg  cholesterol, less than 1.5gm of sodium, & 5 or more servings of fruits and vegetables daily;Weight Loss: Understanding of general recommendations for a balanced deficit meal plan, which promotes 1-2 lb weight loss per week and includes a negative energy balance of [id number] kcal/d;Understanding recommendations for meals to include 15-35% energy as protein, 25-35% energy from fat, 35-60% energy from carbohydrates, less than 200mg  of dietary cholesterol, 20-35 gm of total fiber daily;Understanding of distribution of calorie intake throughout the day with the consumption of 4-5 meals/snacks    Diabetes Yes    Intervention Provide education about signs/symptoms and action to take for hypo/hyperglycemia.;Provide education about proper nutrition, including hydration, and aerobic/resistive exercise prescription along with prescribed medications to achieve blood glucose in  normal ranges: Fasting glucose 65-99 mg/dL    Expected Outcomes Short Term: Participant verbalizes understanding of the signs/symptoms and immediate care of hyper/hypoglycemia, proper foot care and importance of medication, aerobic/resistive exercise and nutrition plan for blood glucose control.;Long Term: Attainment of HbA1C < 7%.    Hypertension Yes    Intervention Provide education on lifestyle modifcations including regular physical activity/exercise, weight management, moderate sodium restriction and increased consumption of fresh fruit, vegetables, and low fat dairy, alcohol moderation, and smoking cessation.;Monitor prescription use compliance.    Expected Outcomes Short Term: Continued assessment and intervention until BP is < 140/22mm HG in hypertensive participants. < 130/22mm HG in hypertensive participants with diabetes, heart failure or chronic kidney disease.;Long Term: Maintenance of blood pressure at goal levels.    Lipids Yes    Intervention Provide education and support for participant on nutrition & aerobic/resistive exercise along with prescribed medications to achieve LDL 70mg , HDL >40mg .    Expected Outcomes Short Term: Participant states understanding of desired cholesterol values and is compliant with medications prescribed. Participant is following exercise prescription and nutrition guidelines.;Long Term: Cholesterol controlled with medications as prescribed, with individualized exercise RX and with personalized nutrition plan. Value goals: LDL < 70mg , HDL > 40 mg.           Core Components/Risk Factors/Patient Goals Review:   Goals and Risk Factor Review    Row Name 04/05/20 91m 04/29/20 1028 05/24/20 1536         Core Components/Risk Factors/Patient Goals Review   Personal Goals Review Weight Management/Obesity;Diabetes;Hypertension;Lipids Weight Management/Obesity;Diabetes;Hypertension;Lipids Weight Management/Obesity;Diabetes;Hypertension;Lipids     Review [place]  started exercise on 04/04/20. Neven did well with exercise vital sign and CBG's stable. Mozell's vital signs and CBG's have been stable at cardiac rehab. Dr 06/27/20 increased his coreg will continue to monitor for symptoms Larri's vital signs and CBG's have been stable at cardiac rehab. Franklin has not had any reports of angina since medications have been adjusted. Traevon will complete cardioac rehab on 05/27/20     Expected Outcomes Norville will continue to participate in phase 2 cardiac rehab for  exercise nutrtion and lifestyle modifications Trevante will continue to participate in phase 2 cardiac rehab for exercise nutrition and lifestyle modifications Dequarius will continue to  exercise upon completion of phase 2 cardiac rehab follow nutrition and lifestyle modifications            Core Components/Risk Factors/Patient Goals at Discharge (Final Review):   Goals and Risk Factor Review - 05/24/20 1536      Core Components/Risk Factors/Patient Goals Review   Personal Goals Review Weight Management/Obesity;Diabetes;Hypertension;Lipids    Review Melburn's vital signs and CBG's have been stable at cardiac rehab. Shashank has not had any reports of angina since medications have been adjusted. Cesare will complete cardioac rehab on 05/27/20    Expected Outcomes Naveed will continue to  exercise upon completion of phase 2 cardiac rehab follow nutrition and lifestyle modifications           ITP Comments:  ITP Comments    Row Name 03/31/20 3244 04/05/20 0929 04/29/20 1022 05/24/20 1534     ITP Comments Dr Armanda Magic MD, Medical Director 30 Day ITP Review. Norvil started exercise on 04/04/20. Patient did well with exercise. Vital sings stable 30 Day ITP Review. Kenyen has good attendance and participation in phase 2 cardiac rehab 30 Day ITP Review. Tallie has good attendance and participation in phase 2 cardiac rehab. Tanish will complete cardiac rehab on 05/27/20.           Comments: See ITP comments.Gladstone Lighter, RN,BSN 05/24/2020 3:39  PM

## 2020-05-25 ENCOUNTER — Other Ambulatory Visit: Payer: Self-pay

## 2020-05-25 ENCOUNTER — Encounter (HOSPITAL_COMMUNITY)
Admission: RE | Admit: 2020-05-25 | Discharge: 2020-05-25 | Disposition: A | Discharge status: Home / Self Care | Payer: Medicare Other | Source: Ambulatory Visit | Attending: Cardiology | Admitting: Cardiology

## 2020-05-25 DIAGNOSIS — Z955 Presence of coronary angioplasty implant and graft: Secondary | ICD-10-CM | POA: Diagnosis present

## 2020-05-25 DIAGNOSIS — I2102 ST elevation (STEMI) myocardial infarction involving left anterior descending coronary artery: Secondary | ICD-10-CM

## 2020-05-27 ENCOUNTER — Encounter (HOSPITAL_COMMUNITY)
Admission: RE | Admit: 2020-05-27 | Discharge: 2020-05-27 | Disposition: A | Discharge status: Home / Self Care | Payer: Medicare Other | Source: Ambulatory Visit | Attending: Cardiology | Admitting: Cardiology

## 2020-05-27 ENCOUNTER — Other Ambulatory Visit: Payer: Self-pay

## 2020-05-27 VITALS — Ht 69.0 in | Wt 240.3 lb

## 2020-05-27 DIAGNOSIS — Z955 Presence of coronary angioplasty implant and graft: Secondary | ICD-10-CM

## 2020-05-27 DIAGNOSIS — I2102 ST elevation (STEMI) myocardial infarction involving left anterior descending coronary artery: Secondary | ICD-10-CM

## 2020-06-07 ENCOUNTER — Encounter: Payer: Self-pay | Admitting: Student

## 2020-06-07 ENCOUNTER — Other Ambulatory Visit: Payer: Self-pay

## 2020-06-07 ENCOUNTER — Ambulatory Visit: Payer: Medicare Other | Admitting: Student

## 2020-06-07 VITALS — BP 130/84 | HR 70 | Temp 98.2°F | Resp 17 | Ht 69.0 in | Wt 242.0 lb

## 2020-06-07 DIAGNOSIS — I5022 Chronic systolic (congestive) heart failure: Secondary | ICD-10-CM

## 2020-06-07 DIAGNOSIS — I2510 Atherosclerotic heart disease of native coronary artery without angina pectoris: Secondary | ICD-10-CM

## 2020-06-07 DIAGNOSIS — I10 Essential (primary) hypertension: Secondary | ICD-10-CM

## 2020-06-07 NOTE — Progress Notes (Signed)
Primary Physician/Referring:  Eartha InchBadger, Michael C, MD  Patient ID: Cory Sosa, male    DOB: 05/06/1955, 65 y.o.   MRN: [id number]  Chief Complaint  Patient presents with  . Chest Pain    6 week  . Coronary Artery Disease   HPI:    Cory FantasiaJohn Kadrmas  is a 65 y.o. male patient with hypertension, hyperlipidemia, diabetes mellitus, with recent ST elevation MI 02/07/2020 with successful PCI and stenting to the proximal and mid LAD.  Patient has been unable to tolerate metoprolol in the past due to frequent bowel movements.   Patient presents for 6-week follow-up of angina and heart failure.  At last visit ordered an echocardiogram and increase carvedilol from 6.25 mg twice daily to 12.5 mg twice daily.  Patient is tolerating increased carvedilol dosing without issue.  He has had no recurrence of substernal chest pressure since increased carvedilol dose.  Overall patient is without specific complaints today and is feeling well.  Denies chest pain, dyspnea, dizziness, fatigue, palpitations, syncope, near syncope.  Denies orthopnea, PND, leg swelling.   Patient has successfully completed cardiac rehab and has now joined [facility].  He is exercising 3 days/week for about an hour each day without issue.  Patient has been monitoring his blood pressure at home on a regular basis and brings with him a log that is well controlled.  Past Medical History:  Diagnosis Date  . Coronary artery disease   . Diabetes mellitus without complication (HCC)   . Hyperlipidemia   . Hypertension    controlled with medications   Past Surgical History:  Procedure Laterality Date  . CARDIAC CATHETERIZATION    . CORONARY THROMBECTOMY N/A 02/07/2020   Procedure: Coronary Thrombectomy;  Surgeon: Yates DecampGanji, Jay, MD;  [facility];  Service: Cardiovascular;  Laterality: N/A;  . CORONARY/GRAFT ACUTE MI REVASCULARIZATION N/A 02/07/2020   Procedure: Coronary/Graft Acute MI Revascularization;   Surgeon: Yates DecampGanji, Jay, MD;  Location: [facility] INVASIVE CV LAB;  Service: Cardiovascular;  Laterality: N/A;  . LEFT HEART CATH AND CORONARY ANGIOGRAPHY N/A 02/07/2020   Procedure: LEFT HEART CATH AND CORONARY ANGIOGRAPHY;  Surgeon: Yates DecampGanji, Jay, MD;  Location: [facility] INVASIVE CV LAB;  Service: Cardiovascular;  Laterality: N/A;   Family History  Problem Relation Age of Onset  . Pneumonia Mother     Social History   Tobacco Use  . Smoking status: Never Smoker  . Smokeless tobacco: Never Used  Substance Use Topics  . Alcohol use: Never   Marital Status: Married   ROS  Review of Systems  Constitutional: Negative for malaise/fatigue and weight gain.  Cardiovascular: Negative for chest pain, claudication, leg swelling, near-syncope, orthopnea, palpitations, paroxysmal nocturnal dyspnea and syncope.  Respiratory: Negative for shortness of breath.   Hematologic/Lymphatic: Does not bruise/bleed easily.  Gastrointestinal: Negative for melena.  Neurological: Negative for dizziness and weakness.    Objective  Blood pressure 130/84, pulse 70, temperature 98.2 F (36.8 C), temperature source Temporal, resp. rate 17, height 5\' 9"  (1.753 m), weight 242 lb (109.8 kg), SpO2 98 %.  Vitals with BMI 06/07/2020 05/25/2020 04/26/2020  Height 5\' 9"  5\' 9"  5\' 9"   Weight 242 lbs 240 lbs 5 oz 240 lbs 13 oz  BMI 35.72 35.47 35.54  Systolic 130 - 131  Diastolic 84 - 86  Pulse 70 - 73      Physical Exam Vitals reviewed.  Constitutional:      Appearance: He is obese.  HENT:  Head: Normocephalic and atraumatic.  Cardiovascular:     Rate and Rhythm: Normal rate and regular rhythm.  No extrasystoles are present.    Pulses: Intact distal pulses.     Heart sounds: S1 normal and S2 normal. No murmur heard. No gallop.      Comments: No JVD, No edema.   Pulmonary:     Effort: Pulmonary effort is normal. No respiratory distress.     Breath sounds: No wheezing, rhonchi or rales.  Musculoskeletal:     Right lower leg:  No edema.     Left lower leg: No edema.  Skin:    General: Skin is warm and dry.     Capillary Refill: Capillary refill takes less than 2 seconds.     Findings: No erythema. Rash:   Neurological:     General: No focal deficit present.     Mental Status: He is alert and oriented to person, place, and time.     Laboratory examination:   Recent Labs    02/07/20 2018 02/07/20 2050 02/08/20 0612  NA 140 140 139  K 3.5 3.3* 4.9  CL 105 107 104  CO2 24  --  23  GLUCOSE 178* 166* 325*  BUN 14 14 15   CREATININE 0.97 0.80 1.06  CALCIUM 9.5  --  9.1  GFRNONAA >60  --  >60   CrCl cannot be calculated (Patient's most recent lab result is older than the maximum 21 days allowed.).  CMP Latest Ref Rng & Units 02/08/2020 02/07/2020 02/07/2020  Glucose 70 - 99 mg/dL 02/09/2020) 333(L) 456(Y)  BUN 8 - 23 mg/dL 15 14 14   Creatinine 0.61 - 1.24 mg/dL 563(S 9.37  Sodium 135 - 145 mmol/L 139 140 140  Potassium 3.5 - 5.1 mmol/L 4.9 3.3(L) 3.5  Chloride 98 - 111 mmol/L 104 107 105  CO2 22 - 32 mmol/L 23 - 24  Calcium 8.9 - 10.3 mg/dL 9.1 - 9.5  Total Protein 6.5 - 8.1 g/dL - - 6.8  Total Bilirubin 0.3 - 1.2 mg/dL - - 0.5  Alkaline Phos 38 - 126 U/L - - 57  AST 15 - 41 U/L - - 45(H)  ALT 0 - 44 U/L - - 72(H)   CBC Latest Ref Rng & Units 02/08/2020 02/07/2020 02/07/2020  WBC 4.0 - 10.5 K/uL 8.0 - 10.3  Hemoglobin 13.0 - 17.0 g/dL 02/09/2020 02/09/2020 81.1  Hematocrit 39.0 - 52.0 % 42.8 40.0 45.0  Platelets 150 - 400 K/uL 275 - 302    Lipid Panel Recent Labs    02/07/20 2018  CHOL 116  TRIG 207*  LDLCALC 33  VLDL 41*  HDL 42  CHOLHDL 2.8    HEMOGLOBIN A1C Lab Results  Component Value Date   HGBA1C 8.1 (H) 02/07/2020   MPG 185.77 02/07/2020   TSH No results for input(s): TSH in the last 8760 hours.  External labs:  None   Medications and allergies   Allergies  Allergen Reactions  . Contrast Media [Iodinated Diagnostic Agents] Nausea And Vomiting  . Rocephin [Ceftriaxone Sodium  In Dextrose] Nausea And Vomiting     Outpatient Medications Prior to Visit  Medication Sig Dispense Refill  . aspirin 81 MG chewable tablet Chew 1 tablet (81 mg total) by mouth daily.    . Canagliflozin-metFORMIN HCl (INVOKAMET PO) Take 1,000 mg by mouth 2 (two) times daily. 50-1000    . carvedilol (COREG) 6.25 MG tablet Take 2 tablets (12.5 mg total) by mouth 2 (two) times daily.  180 tablet 3  . ergocalciferol (VITAMIN D2) 1.25 MG (50000 UT) capsule Take 50,000 Units by mouth once a week. Sunday    . insulin aspart (NOVOLOG FLEXPEN) 100 UNIT/ML FlexPen Inject 24 Units into the skin 3 (three) times daily with meals. And 10 units with a snack    . insulin glargine, 2 Unit Dial, (TOUJEO MAX SOLOSTAR) 300 UNIT/ML Solostar Pen Inject 60 Units into the skin daily.    Marland Kitchen losartan (COZAAR) 25 MG tablet Take 1 tablet (25 mg total) by mouth every evening. 90 tablet 3  . Multiple Vitamin tablet Take 1 tablet by mouth daily.    . nitroGLYCERIN (NITROSTAT) 0.4 MG SL tablet Place 1 tablet (0.4 mg total) under the tongue every 5 (five) minutes as needed for chest pain. 25 tablet 1  . nystatin ointment (MYCOSTATIN) Apply 1 application topically 2 (two) times daily as needed. Balanitis    . pantoprazole (PROTONIX) 40 MG tablet Take 40 mg by mouth daily.    . rosuvastatin (CRESTOR) 20 MG tablet Take 20 mg by mouth at bedtime.    . SUPER B COMPLEX/C PO Take 1 tablet by mouth daily.    . ticagrelor (BRILINTA) 90 MG TABS tablet Take 1 tablet (90 mg total) by mouth 2 (two) times daily. 180 tablet 3  . zolpidem (AMBIEN) 10 MG tablet Take 5 mg by mouth at bedtime as needed for sleep.   2   No facility-administered medications prior to visit.     Radiology:   No results found. DG Chest Portable 1 View 02/07/2020 CLINICAL DATA:  Chest pain, dizziness, nausea EXAM: PORTABLE CHEST 1 VIEW COMPARISON:  03/03/2018 FINDINGS: Single frontal view of the chest demonstrates an unremarkable cardiac silhouette. No airspace  disease, effusion, or pneumothorax. No acute bony abnormality.   Cardiac Studies:   PCV ECHOCARDIOGRAM COMPLETE 05/10/2020 Normal LV systolic function with visual EF 55-60%. Left ventricle cavity is normal in size. Normal global wall motion. Normal diastolic filling pattern, normal LAP. No significant valvular heart disease. Compared to prior study dated 02/08/2020: LVEF has improved from 40-45% to 55-60% with no regional wall motion abnormalities. Otherwise, no significant changes noted.   Coronary angiography and angioplasty 02/07/2020: RCA: Dominant, mild disease throughout. Left main: Smooth and normal. Circumflex: Moderate sized, mild diffuse disease. Ramus intermediate: Mild disease. Small. LAD: Large vessel. Occluded after the origin of a large D1. Successful thrombectomy followed by overlapping 4.0 x 38 resolute Onyx and 3.5 x 12 mm Synergy DES deployed at 12 atmospheric pressure each, distal stent was postdilated with the same stent balloon at 16 atmospheric pressure at the overlap. TIMI 0 to TIMI III flow improved, no evidence of edge dissection. 100 mL contrast utilized.  Recommendation: Patient will be observed in the intensive care unit. He will be started on dual antiplatelet therapy and aggressive risk factor modification will be continued  EKG:   EKG 04/26/2020: Sinus rhythm at a rate of 63 bpm, left atrial enlargement.  Normal axis.  No evidence of ischemia or underlying injury pattern.  Compared to EKG 03/15/2020, no PVCs present.   EKG 02/15/2020: Sinus rhythm at a rate of 62 bpm, left atrial enlargement.  Normal axis.  Poor R wave progression, cannot exclude anteroseptal infarct old.  Nonspecific T wave abnormality.  Compared to EKG 02/08/2020, no significant change.  EKG 02/08/2020: Normal sinus rhythm/sinus bradycardia at rate of 59 bpm, normal axis, nonspecific T abnormality in the anterolateral leads.  EKG 02/07/2020: Normal sinus rhythm at a rate  of 76 bpm,  hyperacute T waves changes in the anterolateral leads in V1 to V4 suggestive of acute STEMI in the anterolateral wall. This is new compared to prior EKG from 2019.  Assessment     ICD-10-CM   1. Coronary artery disease involving native coronary artery of native heart without angina pectoris  I25.10   2. Chronic systolic (congestive) heart failure (HCC)  I50.22   3. Essential hypertension  I10      There are no discontinued medications.  No orders of the defined types were placed in this encounter.   Recommendations:   Haygen Zebrowski is a 65 y.o. male patient with hypertension, hyperlipidemia, diabetes mellitus, with recent ST elevation MI 02/07/2020 with successful PCI and stenting to the proximal and mid LAD.  Patient presents for 6-week follow-up of angina and heart failure.  Repeat echocardiogram revealed improved LVEF to 55-60% without regional wall motion abnormalities, details above.  There are no clinical signs of heart failure and patient is feeling well without recurrence of chest discomfort.  He is tolerating guideline directed medical therapy including aspirin, Brilinta, carvedilol, losartan, and rosuvastatin.  We will continue present medication.  Patient's blood pressure and lipids are well controlled.  Advised him to continue to exercise on a regular basis and abide by complying diet.  Overall patient is stable from a cardiovascular standpoint.  Follow-up in 6 months, sooner if needed, for coronary artery disease.   Rayford Halsted, PA-C 06/07/2020, 12:27 PM Office: [phone]

## 2020-06-07 NOTE — Progress Notes (Signed)
Discharge Progress Report  Patient Details  Name: Cory Sosa MRN: [id number] Date of Birth: Jan 19, 1956 Referring Provider:   Flowsheet Row CARDIAC REHAB PHASE II ORIENTATION from 03/31/2020 in [place]  Referring Provider Adrian Prows, MD       Number of Visits: 23  Reason for Discharge:  Patient reached a stable level of exercise. Patient independent in their exercise. Patient has met program and personal goals.  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 02/07/20   ADL UCSD:   Initial Exercise Prescription:  Initial Exercise Prescription - 03/31/20 1100      Date of Initial Exercise RX and Referring Provider   Date 03/31/20    Referring Provider Adrian Prows, MD    Expected Discharge Date 05/27/20      NuStep   Level 3    SPM 85    Minutes 15    METs 2.4      Track   Laps 15    Minutes 15    METs 2.74      Prescription Details   Frequency (times per week) 3    Duration Progress to 30 minutes of continuous aerobic without signs/symptoms of physical distress      Intensity   THRR 40-80% of Max Heartrate 62-125    Ratings of Perceived Exertion 11-13    Perceived Dyspnea 0-4      Progression   Progression Continue progressive overload as per policy without signs/symptoms or physical distress.      Resistance Training   Training Prescription Yes    Weight 5 lbs    Reps 10-15           Discharge Exercise Prescription (Final Exercise Prescription Changes):  Exercise Prescription Changes - 05/27/20 1030      Response to Exercise   Blood Pressure (Admit) 110/62    Blood Pressure (Exercise) 140/80    Blood Pressure (Exit) 104/70    Heart Rate (Admit) 71 bpm    Heart Rate (Exercise) 96  bpm    Heart Rate (Exit) 76 bpm    Rating of Perceived Exertion (Exercise) 12    Symptoms None    Comments Pt graduated from the CRP2 program today    Duration Continue with 30 min of aerobic exercise without signs/symptoms of physical distress.    Intensity THRR unchanged      Progression   Progression Continue to progress workloads to maintain intensity without signs/symptoms of physical distress.    Average METs 3.2      Resistance Training   Training Prescription Yes    Weight 6 lbs    Reps 10-15    Time 10 Minutes      Interval Training   Interval Training No      NuStep   Level 5    SPM 85    Minutes 15    METs 3      Track   Laps 21    Minutes 15    METs 3.44      Home Exercise Plan   Plans to continue exercise at [facility] (comment)    Frequency Add 3 additional days to program exercise sessions.    Initial Home Exercises Provided 04/18/20  Functional Capacity:  6 Minute Walk    Row Name 03/31/20 0833 05/23/20 0850       6 Minute Walk   Phase Initial Discharge    Distance 1649 feet 1987 feet    Distance % Change -- 20.5 %    Distance Feet Change -- 338 ft    Walk Time 6 minutes 6 minutes    # of Rest Breaks 0 0    MPH 3.12 3.76    METS 3.24 4.03    RPE 6 12    Perceived Dyspnea  0 0    VO2 Peak 11.35 14.1    Symptoms No No    Resting HR 68 bpm 79 bpm    Resting BP 114/72 108/70    Resting Oxygen Saturation  96 % 99 %    Exercise Oxygen Saturation  during 6 min walk 96 % 98 %    Max Ex. HR 88 bpm 109 bpm    Max Ex. BP 126/76 128/70    2 Minute Post BP 110/70 --           Psychological, QOL, Others - Outcomes: PHQ 2/9: Depression screen [facility] 2/9 05/25/2020 03/31/2020  Decreased Interest 0 0  Down, Depressed, Hopeless 0 0  PHQ - 2 Score 0 0  Altered sleeping - 0  Tired, decreased energy - 1  Change in appetite - 0  Feeling bad or failure about yourself  - 0  Trouble concentrating - 0  Moving slowly or fidgety/restless  - 0  Suicidal thoughts - 0  PHQ-9 Score - 1  Difficult doing work/chores - Not difficult at all    Quality of Life:  Quality of Life - 05/27/20 1100      Quality of Life Scores   Health/Function Post 20.13 %    Socioeconomic Post 25 %    Psych/Spiritual Post 18.92 %    Family Post 26.1 %    GLOBAL Post 21.94 %           Personal Goals: Goals established at orientation with interventions provided to work toward goal.  Personal Goals and Risk Factors at Admission - 03/31/20 1055      Core Components/Risk Factors/Patient Goals on Admission    Weight Management Yes;Obesity;Weight Loss    Intervention Weight Management: Develop a combined nutrition and exercise program designed to reach desired caloric intake, while maintaining appropriate intake of nutrient and fiber, sodium and fats, and appropriate energy expenditure required for the weight goal.;Weight Management: Provide education and appropriate resources to help participant work on and attain dietary goals.;Weight Management/Obesity: Establish reasonable short term and long term weight goals.;Obesity: Provide education and appropriate resources to help participant work on and attain dietary goals.    Admit Weight 238 lb 15.7 oz (108.4 kg)    Expected Outcomes Short Term: Continue to assess and modify interventions until short term weight is achieved;Long Term: Adherence to nutrition and physical activity/exercise program aimed toward attainment of established weight goal;Weight Maintenance: Understanding of the daily nutrition guidelines, which includes 25-35% calories from fat, 7% or less cal from saturated fats, less than 256m cholesterol, less than 1.5gm of sodium, & 5 or more servings of fruits and vegetables daily;Weight Loss: Understanding of general recommendations for a balanced deficit meal plan, which promotes 1-2 lb weight loss per week and includes a negative energy balance of [phone] kcal/d;Understanding recommendations  for meals to include 15-35% energy as protein, 25-35% energy from fat, 35-60% energy from carbohydrates, less than 208m  of dietary cholesterol, 20-35 gm of total fiber daily;Understanding of distribution of calorie intake throughout the day with the consumption of 4-5 meals/snacks    Diabetes Yes    Intervention Provide education about signs/symptoms and action to take for hypo/hyperglycemia.;Provide education about proper nutrition, including hydration, and aerobic/resistive exercise prescription along with prescribed medications to achieve blood glucose in normal ranges: Fasting glucose 65-99 mg/dL    Expected Outcomes Short Term: Participant verbalizes understanding of the signs/symptoms and immediate care of hyper/hypoglycemia, proper foot care and importance of medication, aerobic/resistive exercise and nutrition plan for blood glucose control.;Long Term: Attainment of HbA1C < 7%.    Hypertension Yes    Intervention Provide education on lifestyle modifcations including regular physical activity/exercise, weight management, moderate sodium restriction and increased consumption of fresh fruit, vegetables, and low fat dairy, alcohol moderation, and smoking cessation.;Monitor prescription use compliance.    Expected Outcomes Short Term: Continued assessment and intervention until BP is < 140/74m HG in hypertensive participants. < 130/854mHG in hypertensive participants with diabetes, heart failure or chronic kidney disease.;Long Term: Maintenance of blood pressure at goal levels.    Lipids Yes    Intervention Provide education and support for participant on nutrition & aerobic/resistive exercise along with prescribed medications to achieve LDL <7062mHDL >16m23m  Expected Outcomes Short Term: Participant states understanding of desired cholesterol values and is compliant with medications prescribed. Participant is following exercise prescription and nutrition guidelines.;Long Term: Cholesterol  controlled with medications as prescribed, with individualized exercise RX and with personalized nutrition plan. Value goals: LDL < 70mg75mL > 40 mg.            Personal Goals Discharge:  Goals and Risk Factor Review    Row Name 04/05/20 0938 [id number] 1028 05/24/20 1536         Core Components/Risk Factors/Patient Goals Review   Personal Goals Review Weight Management/Obesity;Diabetes;Hypertension;Lipids Weight Management/Obesity;Diabetes;Hypertension;Lipids Weight Management/Obesity;Diabetes;Hypertension;Lipids     Review Duante started exercise on 04/04/20. Blakeley did well with exercise vital sign and CBG's stable. Montez's vital signs and CBG's have been stable at cardiac rehab. Dr GanjiEinar Gipeased his coreg will continue to monitor for symptoms Mandrell's vital signs and CBG's have been stable at cardiac rehab. Pranav Trevaughnnot had any reports of angina since medications have been adjusted. Bryton will complete cardioac rehab on 05/27/20     Expected Outcomes Adith Deland continue to participate in phase 2 cardiac rehab for exercise nutrtion and lifestyle modifications Jaishawn Edwen continue to participate in phase 2 cardiac rehab for exercise nutrition and lifestyle modifications Avry Les continue to  exercise upon completion of phase 2 cardiac rehab follow nutrition and lifestyle modifications            Exercise Goals and Review:  Exercise Goals    Row Name 03/31/20 1108             Exercise Goals   Increase Physical Activity Yes       Intervention Provide advice, education, support and counseling about physical activity/exercise needs.;Develop an individualized exercise prescription for aerobic and resistive training based on initial evaluation findings, risk stratification, comorbidities and participant's personal goals.       Expected Outcomes Short Term: Attend rehab on a regular basis to increase amount of physical activity.;Long Term: Add in home exercise to make exercise part of routine and to  increase amount of physical activity.;Long Term: Exercising regularly at least 3-5 days a week.       Increase Strength  and Stamina Yes       Intervention Provide advice, education, support and counseling about physical activity/exercise needs.;Develop an individualized exercise prescription for aerobic and resistive training based on initial evaluation findings, risk stratification, comorbidities and participant's personal goals.       Expected Outcomes Short Term: Increase workloads from initial exercise prescription for resistance, speed, and METs.;Short Term: Perform resistance training exercises routinely during rehab and add in resistance training at home;Long Term: Improve cardiorespiratory fitness, muscular endurance and strength as measured by increased METs and functional capacity (6MWT)       Able to understand and use rate of perceived exertion (RPE) scale Yes       Intervention Provide education and explanation on how to use RPE scale       Expected Outcomes Short Term: Able to use RPE daily in rehab to express subjective intensity level;Long Term:  Able to use RPE to guide intensity level when exercising independently       Knowledge and understanding of Target Heart Rate Range (THRR) Yes       Intervention Provide education and explanation of THRR including how the numbers were predicted and where they are located for reference       Expected Outcomes Short Term: Able to state/look up THRR;Short Term: Able to use daily as guideline for intensity in rehab;Long Term: Able to use THRR to govern intensity when exercising independently       Understanding of Exercise Prescription Yes       Intervention Provide education, explanation, and written materials on patient's individual exercise prescription       Expected Outcomes Short Term: Able to explain program exercise prescription;Long Term: Able to explain home exercise prescription to exercise independently              Exercise Goals  Re-Evaluation:  Exercise Goals Re-Evaluation    Row Name 04/04/20 1037 04/18/20 1000 04/19/20 0824 05/02/20 1133 05/27/20 1030     Exercise Goal Re-Evaluation   Exercise Goals Review Increase Physical Activity;Increase Strength and Stamina;Able to understand and use rate of perceived exertion (RPE) scale;Knowledge and understanding of Target Heart Rate Range (THRR);Understanding of Exercise Prescription Increase Physical Activity;Increase Strength and Stamina;Able to understand and use rate of perceived exertion (RPE) scale;Knowledge and understanding of Target Heart Rate Range (THRR);Able to check pulse independently;Understanding of Exercise Prescription -- Increase Physical Activity;Increase Strength and Stamina;Able to understand and use rate of perceived exertion (RPE) scale;Knowledge and understanding of Target Heart Rate Range (THRR);Able to check pulse independently;Understanding of Exercise Prescription Increase Physical Activity;Increase Strength and Stamina;Able to understand and use rate of perceived exertion (RPE) scale;Knowledge and understanding of Target Heart Rate Range (THRR);Able to check pulse independently;Understanding of Exercise Prescription   Comments Pt's first day of exercise in the CRP2 program. Pt understands the RPE scale, THRR, and Exercise Rx. Reviewed home exercise Rx with patient. Pt will be walking 2-3x/week to increase his CV fitness. Pt has a treadmill that he will be using as well. -- Reviewed Goals with patient. Pt voices making good progress toward his goals. Pt is feeling stonger and is walking on his off days from the CRP2 program 30-60 minutes. Pt graduated from the [place] program today. Pt made good progress and had an average MET level of 3.2. Pt plans to contiune his exercise by walking/gym. Pt feels stonger and has more endurance which the patient voices is evident in his daily walks. Additionally, the performace on the post 6 minute walk test  inceased by 20.5%  (338 ft).   Expected Outcomes Will continue to monitor patient and progress exercise workloads as tolerated. Pt will exercise at home on his own 2-3x/week for 30-45 minutes. -- Pt will exercise at home on his own 2-4 x/week for 30-45 minutes. Pt will continue to exercise on his own at home/gym.          Nutrition & Weight - Outcomes:   Post Biometrics - 05/25/20 1000       Post  Biometrics   Height _0  (1.753 m)    Weight 109 kg    Waist Circumference 47.5 inches    Hip Circumference 47.5 inches    Waist to Hip Ratio 1 %    BMI (Calculated) 35.47    Triceps Skinfold 12 mm    % Body Fat 32.8 %    Grip Strength 43 kg    Flexibility 11 in    Single Leg Stand 15.18 seconds           Nutrition:  Nutrition Therapy & Goals - 04/14/20 0843      Nutrition Therapy   Diet TLC; carb modified    Drug/Food Interactions Statins/Certain Fruits      Personal Nutrition Goals   Nutrition Goal Pt to learn diabetes meal planning (carb counting, insulin to carb ratio, and correction factors).    Personal Goal #2 Pt to identify food quantities necessary to achieve weight loss of 6-24 lb at graduation from cardiac rehab.    Personal Goal #3 Pt to build a healthy plate including vegetables, fruits, whole grains, and low-fat dairy products in a heart healthy meal plan.    Personal Goal #4 Maintain glycemic target of TIR >70%      Intervention Plan   Intervention Prescribe, educate and counsel regarding individualized specific dietary modifications aiming towards targeted core components such as weight, hypertension, lipid management, diabetes, heart failure and other comorbidities.;Nutrition handout(s) given to patient.    Expected Outcomes Long Term Goal: Adherence to prescribed nutrition plan.;Short Term Goal: A plan has been developed with personal nutrition goals set during dietitian appointment.           Nutrition Discharge:   Education Questionnaire Score:  Knowledge Questionnaire  Score - 05/27/20 1100      Knowledge Questionnaire Score   Post Score 24/24           Goals reviewed with patient; copy given to patient.Pt graduated from cardiac rehab program on 05/27/20 with completion of 22 exercise sessions in Phase II. Pt maintained good attendance and progressed nicely during his participation in rehab as evidenced by increased MET level.   Medication list reconciled. Repeat  PHQ score-0  .  Pt has made significant lifestyle changes and should be commended for his success. Pt feels he has achieved his goals during cardiac rehab. Esdras increased his distance on his post exercise walk test by 338 feet1 Pt plans to continue exercise by walking at home and is going to look at joining the [employer]. We are proud of Tarrance's progress.Barnet Pall, RN,BSN 06/07/2020 1:49 PM

## 2020-11-04 ENCOUNTER — Inpatient Hospital Stay: Payer: Medicare Other

## 2020-11-04 ENCOUNTER — Other Ambulatory Visit: Payer: Self-pay

## 2020-11-04 ENCOUNTER — Ambulatory Visit: Payer: Medicare Other | Admitting: Student

## 2020-11-04 ENCOUNTER — Encounter: Payer: Self-pay | Admitting: Student

## 2020-11-04 VITALS — BP 123/78 | HR 73 | Resp 17 | Ht 69.0 in | Wt 240.0 lb

## 2020-11-04 DIAGNOSIS — R002 Palpitations: Secondary | ICD-10-CM

## 2020-11-04 DIAGNOSIS — I2510 Atherosclerotic heart disease of native coronary artery without angina pectoris: Secondary | ICD-10-CM

## 2020-11-04 NOTE — Progress Notes (Signed)
Primary Physician/Referring:  Eartha InchBadger, Michael C, MD  Patient ID: Cory Sosa, male    DOB: 11/02/1955, 65 y.o.   MRN: [id number]  Chief Complaint  Patient presents with   Eye Injury   Coronary artery disease involving native coronary artery of   HPI:    Cory Sosa  is a 65 y.o. male patient with hypertension, hyperlipidemia, diabetes mellitus, with recent ST elevation MI 02/07/2020 with successful PCI and stenting to the proximal and mid LAD.  Patient has been unable to tolerate metoprolol in the past due to frequent bowel movements.   Patient presents for urgent visit today as he was being seen by his optometrist for annual eye exam and optometrist expressed concern that patient had "blockage" in high artery.  Patient was advised by optometrist to be seen by his cardiologist immediately.  Patient is asymptomatic.  Denies vision changes, headache, symptoms suggestive of TIA/CVA.  Denies chest pain, dyspnea, dizziness, fatigue, syncope, near syncope.  He has had occasional palpitations in the past.  Past Medical History:  Diagnosis Date   Coronary artery disease    Diabetes mellitus without complication (HCC)    Hyperlipidemia    Hypertension    controlled with medications   Past Surgical History:  Procedure Laterality Date   CARDIAC CATHETERIZATION     CORONARY THROMBECTOMY N/A 02/07/2020   Procedure: Coronary Thrombectomy;  Surgeon: Yates DecampGanji, Jay, MD;  [facility];  Service: Cardiovascular;  Laterality: N/A;   CORONARY/GRAFT ACUTE MI REVASCULARIZATION N/A 02/07/2020   Procedure: Coronary/Graft Acute MI Revascularization;  Surgeon: Yates DecampGanji, Jay, MD;  Location: [facility] IN[facility]AS[facility] CV LAB;  Service: Cardiovascular;  Laterality: N/A;   LEFT HEART CATH AND CORONARY ANGIOGRAPHY N/A 02/07/2020   Procedure: LEFT HEART CATH AND CORONARY ANGIOGRAPHY;  Surgeon: Yates DecampGanji, Jay, MD;  Location: [facility] INVASIVE CV LAB;  Service: Cardiovascular;  Laterality: N/A;   Family History  Problem Relation  Age of Onset   Pneumonia Mother     Social History   Tobacco Use   Smoking status: Never   Smokeless tobacco: Never  Substance Use Topics   Alcohol use: Never   Marital Status: Married   ROS  Review of Systems  Constitutional: Negative for malaise/fatigue and weight gain.  Eyes:  Negative for blurred vision, double vision and visual disturbance.  Cardiovascular:  Negative for chest pain, claudication, leg swelling, near-syncope, orthopnea, palpitations, paroxysmal nocturnal dyspnea and syncope.  Respiratory:  Negative for shortness of breath.   Hematologic/Lymphatic: Does not bruise/bleed easily.  Gastrointestinal:  Negative for melena.  Neurological:  Negative for dizziness, focal weakness, headaches, loss of balance, numbness, paresthesias and weakness.   Objective  Blood pressure 123/78, pulse 73, resp. rate 17, height 5\' 9"  (1.753 m), weight 240 lb (108.9 kg), SpO2 96 %.  Vitals with BMI 11/04/2020 06/07/2020 05/25/2020  Height 5\' 9"  5\' 9"  5\' 9"   Weight 240 lbs 242 lbs 240 lbs 5 oz  BMI 35.43 35.72 35.47  Systolic 123 130 -  Diastolic 78 84 -  Pulse 73 70 -      Physical Exam Vitals reviewed.  Constitutional:      Appearance: He is obese.  Cardiovascular:     Rate and Rhythm: Normal rate and regular rhythm. No extrasystoles are present.    Pulses: Intact distal pulses.     Heart sounds: S1 normal and S2 normal. No murmur heard.   No gallop.     Comments: No JVD, No edema.   Pulmonary:  Effort: Pulmonary effort is normal. No respiratory distress.     Breath sounds: No wheezing, rhonchi or rales.  Musculoskeletal:     Right lower leg: No edema.     Left lower leg: No edema.  Skin:    General: Skin is warm and dry.     Capillary Refill: Capillary refill takes less than 2 seconds.     Findings: No erythema.  Neurological:     General: No focal deficit present.     Mental Status: He is alert and oriented to person, place, and time.     Cranial Nerves: No cranial  nerve deficit.     Motor: No weakness.    Laboratory examination:   Recent Labs    02/07/20 2018 02/07/20 2050 02/08/20 0612  NA 140 140 139  K 3.5 3.3* 4.9  CL 105 107 104  CO2 24  --  23  GLUCOSE 178* 166* 325*  BUN 14 14 15   CREATININE 0.97 0.80 1.06  CALCIUM 9.5  --  9.1  GFRNONAA >60  --  >60   CrCl cannot be calculated (Patient's most recent lab result is older than the maximum 21 days allowed.).  CMP Latest Ref Rng & Units 02/08/2020 02/07/2020 02/07/2020  Glucose 70 - 99 mg/dL 02/09/2020) 211(H) 417(E)  BUN 8 - 23 mg/dL 15 14 14   Creatinine 0.61 - 1.24 mg/dL 081(K 4.81  Sodium 135 - 145 mmol/L 139 140 140  Potassium 3.5 - 5.1 mmol/L 4.9 3.3(L) 3.5  Chloride 98 - 111 mmol/L 104 107 105  CO2 22 - 32 mmol/L 23 - 24  Calcium 8.9 - 10.3 mg/dL 9.1 - 9.5  Total Protein 6.5 - 8.1 g/dL - - 6.8  Total Bilirubin 0.3 - 1.2 mg/dL - - 0.5  Alkaline Phos 38 - 126 U/L - - 57  AST 15 - 41 U/L - - 45(H)  ALT 0 - 44 U/L - - 72(H)   CBC Latest Ref Rng & Units 02/08/2020 02/07/2020 02/07/2020  WBC 4.0 - 10.5 K/uL 8.0 - 10.3  Hemoglobin 13.0 - 17.0 g/dL 02/09/2020 02/09/2020 97.0  Hematocrit 39.0 - 52.0 % 42.8 40.0 45.0  Platelets 150 - 400 K/uL 275 - 302    Lipid Panel Recent Labs    02/07/20 2018  CHOL 116  TRIG 207*  LDLCALC 33  VLDL 41*  HDL 42  CHOLHDL 2.8    HEMOGLOBIN A1C Lab Results  Component Value Date   HGBA1C 8.1 (H) 02/07/2020   MPG 185.77 02/07/2020   TSH No results for input(s): TSH in the last 8760 hours.  External labs:  None  Allergies   Allergies  Allergen Reactions   Contrast Media [Iodinated Diagnostic Agents] Nausea And Vomiting   Rocephin [Ceftriaxone Sodium In Dextrose] Nausea And Vomiting      Medications Prior to Visit:   Outpatient Medications Prior to Visit  Medication Sig Dispense Refill   aspirin 81 MG chewable tablet Chew 1 tablet (81 mg total) by mouth daily.     Canagliflozin-metFORMIN HCl (INVOKAMET PO) Take 1,000 mg by mouth 2  (two) times daily. 50-1000     carvedilol (COREG) 6.25 MG tablet Take 2 tablets (12.5 mg total) by mouth 2 (two) times daily. 180 tablet 3   ergocalciferol (VITAMIN D2) 1.25 MG (50000 UT) capsule Take 50,000 Units by mouth once a week. Sunday     insulin aspart (NOVOLOG FLEXPEN) 100 UNIT/ML FlexPen Inject 24 Units into the skin 3 (three) times daily with meals.  And 10 units with a snack     insulin glargine, 2 Unit Dial, (TOUJEO MAX SOLOSTAR) 300 UNIT/ML Solostar Pen Inject 60 Units into the skin daily.     losartan (COZAAR) 25 MG tablet Take 1 tablet (25 mg total) by mouth every evening. 90 tablet 3   nitroGLYCERIN (NITROSTAT) 0.4 MG SL tablet Place 1 tablet (0.4 mg total) under the tongue every 5 (five) minutes as needed for chest pain. 25 tablet 1   nystatin ointment (MYCOSTATIN) Apply 1 application topically 2 (two) times daily as needed. Balanitis     pantoprazole (PROTONIX) 40 MG tablet Take 40 mg by mouth daily.     rosuvastatin (CRESTOR) 20 MG tablet Take 20 mg by mouth at bedtime.     SUPER B COMPLEX/C PO Take 1 tablet by mouth daily.     ticagrelor (BRILINTA) 90 MG TABS tablet Take 1 tablet (90 mg total) by mouth 2 (two) times daily. 180 tablet 3   zolpidem (AMBIEN) 10 MG tablet Take 5 mg by mouth at bedtime as needed for sleep.   2   Multiple Vitamin tablet Take 1 tablet by mouth daily.     No facility-administered medications prior to visit.     Final Medications at End of Visit    Current Meds  Medication Sig   aspirin 81 MG chewable tablet Chew 1 tablet (81 mg total) by mouth daily.   Canagliflozin-metFORMIN HCl (INVOKAMET PO) Take 1,000 mg by mouth 2 (two) times daily. 50-1000   carvedilol (COREG) 6.25 MG tablet Take 2 tablets (12.5 mg total) by mouth 2 (two) times daily.   ergocalciferol (VITAMIN D2) 1.25 MG (50000 UT) capsule Take 50,000 Units by mouth once a week. Sunday   insulin aspart (NOVOLOG FLEXPEN) 100 UNIT/ML FlexPen Inject 24 Units into the skin 3 (three) times  daily with meals. And 10 units with a snack   insulin glargine, 2 Unit Dial, (TOUJEO MAX SOLOSTAR) 300 UNIT/ML Solostar Pen Inject 60 Units into the skin daily.   losartan (COZAAR) 25 MG tablet Take 1 tablet (25 mg total) by mouth every evening.   nitroGLYCERIN (NITROSTAT) 0.4 MG SL tablet Place 1 tablet (0.4 mg total) under the tongue every 5 (five) minutes as needed for chest pain.   nystatin ointment (MYCOSTATIN) Apply 1 application topically 2 (two) times daily as needed. Balanitis   pantoprazole (PROTONIX) 40 MG tablet Take 40 mg by mouth daily.   rosuvastatin (CRESTOR) 20 MG tablet Take 20 mg by mouth at bedtime.   SUPER B COMPLEX/C PO Take 1 tablet by mouth daily.   ticagrelor (BRILINTA) 90 MG TABS tablet Take 1 tablet (90 mg total) by mouth 2 (two) times daily.   zolpidem (AMBIEN) 10 MG tablet Take 5 mg by mouth at bedtime as needed for sleep.    [DISCONTINUED] Multiple Vitamin tablet Take 1 tablet by mouth daily.   Radiology:   No results found. DG Chest Portable 1 View 02/07/2020 CLINICAL DATA:  Chest pain, dizziness, nausea EXAM: PORTABLE CHEST 1 VIEW COMPARISON:  03/03/2018 FINDINGS: Single frontal view of the chest demonstrates an unremarkable cardiac silhouette. No airspace disease, effusion, or pneumothorax. No acute bony abnormality.   Cardiac Studies:   PCV ECHOCARDIOGRAM COMPLETE 05/10/2020 Normal LV systolic function with visual EF 55-60%. Left ventricle cavity is normal in size. Normal global wall motion. Normal diastolic filling pattern, normal LAP. No significant valvular heart disease. Compared to prior study dated 02/08/2020: LVEF has improved from 40-45% to 55-60% with no  regional wall motion abnormalities. Otherwise, no significant changes noted.   Coronary angiography and angioplasty 02/07/2020: RCA: Dominant, mild disease throughout. Left main: Smooth and normal. Circumflex: Moderate sized, mild diffuse disease. Ramus intermediate: Mild disease.  Small. LAD:  Large vessel.  Occluded after the origin of a large D1.  Successful thrombectomy followed by overlapping 4.0 x 38 resolute Onyx and 3.5 x 12 mm Synergy DES deployed at 12 atmospheric pressure each, distal stent was postdilated with the same stent balloon at 16 atmospheric pressure at the overlap.  TIMI 0 to TIMI III flow improved, no evidence of edge dissection.  100 mL contrast utilized.   Recommendation: Patient will be observed in the intensive care unit.  He will be started on dual antiplatelet therapy and aggressive risk factor modification will be continued  EKG:   11/04/2020: Sinus rhythm at a rate of 66 bpm.  Left atrial enlargement.  Normal axis.  No evidence of ischemia or underlying injury pattern.  Compared to EKG 04/26/2020, no significant change.   EKG 02/15/2020: Sinus rhythm at a rate of 62 bpm, left atrial enlargement.  Normal axis.  Poor R wave progression, cannot exclude anteroseptal infarct old.  Nonspecific T wave abnormality.  Compared to EKG 02/08/2020, no significant change.  EKG 02/08/2020: Normal sinus rhythm/sinus bradycardia at rate of 59 bpm, normal axis, nonspecific T abnormality in the anterolateral leads.   EKG 02/07/2020: Normal sinus rhythm at a rate of 76 bpm, hyperacute T waves changes in the anterolateral leads in V1 to V4 suggestive of acute STEMI in the anterolateral wall.  This is new compared to prior EKG from 2019.  Assessment     ICD-10-CM   1. Coronary artery disease involving native coronary artery of native heart without angina pectoris  I25.10 EKG 12-Lead    PCV CAROTID DUPLEX (BILATERAL)    2. Palpitations  R00.2 LONG TERM MONITOR (3-14 DAYS)       Medications Discontinued During This Encounter  Medication Reason   Multiple Vitamin tablet Error    No orders of the defined types were placed in this encounter.   Recommendations:   Cory Sosa is a 65 y.o. male patient with hypertension, hyperlipidemia, diabetes mellitus, with recent ST  elevation MI 02/07/2020 with successful PCI and stenting to the proximal and mid LAD.  Patient presents for urgent visit following annual eye exam during which optometrist noted concern for thromboembolic phenomenon.  Given concern for thromboembolic phenomenon will obtain carotid artery duplex and 2-week cardiac monitor to evaluate for underlying atrial fibrillation, particularly given patient's history of hypertension and CAD.  Patient is presently on aspirin and Plavix as well as statin therapy, will continue this regimen.  Patient is otherwise stable from a cardiovascular standpoint and asymptomatic.  Counseled patient regarding signs and symptoms that would warrant urgent or emergent evaluation, he verbalized understanding agreement.  Blood pressure and lipids are both well controlled.  Follow-up in 8 weeks, sooner if needed, for results of cardiac testing.   Rayford Halsted, PA-C 11/04/2020, 2:21 PM Office: [phone]

## 2020-12-01 ENCOUNTER — Ambulatory Visit: Payer: Medicare Other | Admitting: Student

## 2020-12-01 ENCOUNTER — Other Ambulatory Visit: Payer: Self-pay

## 2020-12-01 ENCOUNTER — Ambulatory Visit: Payer: Medicare Other

## 2020-12-01 ENCOUNTER — Encounter: Payer: Self-pay | Admitting: Student

## 2020-12-01 VITALS — BP 115/75 | HR 63 | Temp 98.0°F | Ht 69.0 in | Wt 241.0 lb

## 2020-12-01 DIAGNOSIS — I2510 Atherosclerotic heart disease of native coronary artery without angina pectoris: Secondary | ICD-10-CM

## 2020-12-01 DIAGNOSIS — I493 Ventricular premature depolarization: Secondary | ICD-10-CM

## 2020-12-01 DIAGNOSIS — I5022 Chronic systolic (congestive) heart failure: Secondary | ICD-10-CM

## 2020-12-01 NOTE — Progress Notes (Signed)
Primary Physician/Referring:  [name], MD  Patient ID: Cory Sosa, male    DOB: 1956/02/17, 65 y.o.   MRN: [id number]  Chief Complaint  Patient presents with   Coronary Artery Disease   Follow-up   Results   HPI:    Cory Sosa  is a 65 y.o. male patient with hypertension, hyperlipidemia, diabetes mellitus, with recent ST elevation MI 02/07/2020 with successful PCI and stenting to the proximal and mid LAD.  Patient has been unable to tolerate metoprolol in the past due to frequent bowel movements.   Patient presents for 8-week follow-up of cardiac testing results.  Last office visit patient was seen urgently at the request of his optometrist because on funduscopic exam patient was noted to have asymmetric diabetic retinopathy raising concern for carotid insufficiency.  Therefore at last office visit ordered cardiac monitor as well as carotid artery duplex to evaluate for underlying causes of thromboembolic phenomenon.  Unfortunately carotid duplex results are not available yet, however cardiac monitor revealed no significant cardiac arrhythmias.  Patient remains asymptomatic. Denies vision changes, headache, symptoms suggestive of TIA/CVA.  Denies chest pain, dyspnea, dizziness, fatigue, syncope, near syncope.  He has had occasional palpitations in the past.  Past Medical History:  Diagnosis Date   Coronary artery disease    Diabetes mellitus without complication (HCC)    Hyperlipidemia    Hypertension    controlled with medications   Past Surgical History:  Procedure Laterality Date   CARDIAC CATHETERIZATION     CORONARY THROMBECTOMY N/A 02/07/2020   Procedure: Coronary Thrombectomy;  Surgeon: Yates Decamp, MD;  Location: [facility] INVASIVE CV LAB;  Service: Cardiovascular;  Laterality: N/A;   CORONARY/GRAFT ACUTE MI REVASCULARIZATION N/A 02/07/2020   Procedure: Coronary/Graft Acute MI Revascularization;  Surgeon: Yates Decamp, MD;  Location: [facility] INVASIVE CV LAB;  Service:  Cardiovascular;  Laterality: N/A;   LEFT HEART CATH AND CORONARY ANGIOGRAPHY N/A 02/07/2020   Procedure: LEFT HEART CATH AND CORONARY ANGIOGRAPHY;  Surgeon: Yates Decamp, MD;  Location: [facility] INVASIVE CV LAB;  Service: Cardiovascular;  Laterality: N/A;   Family History  Problem Relation Age of Onset   Pneumonia Mother     Social History   Tobacco Use   Smoking status: Never   Smokeless tobacco: Never  Substance Use Topics   Alcohol use: Never   Marital Status: Married   ROS  Review of Systems  Constitutional: Negative for malaise/fatigue.  Eyes:  Negative for visual disturbance.  Cardiovascular:  Negative for chest pain, claudication, leg swelling, near-syncope, orthopnea, palpitations, paroxysmal nocturnal dyspnea and syncope.  Respiratory:  Negative for shortness of breath.   Hematologic/Lymphatic: Does not bruise/bleed easily.  Gastrointestinal:  Negative for melena.  Neurological:  Negative for dizziness, focal weakness, headaches, loss of balance, numbness, paresthesias and weakness.   Objective  Blood pressure 115/75, pulse 63, temperature 98 F (36.7 C), height 5\' 9"  (1.753 m), weight 241 lb (109.3 kg), SpO2 96 %.  Vitals with BMI 12/01/2020 11/04/2020 06/07/2020  Height 5\' 9"  5\' 9"  5\' 9"   Weight 241 lbs 240 lbs 242 lbs  BMI 35.57 35.43 35.72  Systolic 115 123 06/09/2020  Diastolic 75 78 84  Pulse 63 73 70      Physical Exam Vitals reviewed.  Constitutional:      Appearance: He is obese.  Cardiovascular:     Rate and Rhythm: Normal rate and regular rhythm. No extrasystoles are present.    Pulses: Intact distal pulses.     Heart sounds: S1 normal  and S2 normal. No murmur heard.   No gallop.     Comments: No JVD, No edema.   Pulmonary:     Effort: Pulmonary effort is normal. No respiratory distress.     Breath sounds: No wheezing, rhonchi or rales.  Musculoskeletal:     Right lower leg: No edema.     Left lower leg: No edema.  Skin:    General: Skin is warm and dry.      Capillary Refill: Capillary refill takes less than 2 seconds.     Findings: No erythema.  Neurological:     General: No focal deficit present.     Mental Status: He is alert and oriented to person, place, and time.     Cranial Nerves: No cranial nerve deficit.     Motor: No weakness.  Physical exam unchanged compared to previous.  Laboratory examination:   Recent Labs    02/07/20 2018 02/07/20 2050 02/08/20 0612  NA 140 140 139  K 3.5 3.3* 4.9  CL 105 107 104  CO2 24  --  23  GLUCOSE 178* 166* 325*  BUN 14 14 15   CREATININE 0.97 0.80 1.06  CALCIUM 9.5  --  9.1  GFRNONAA >60  --  >60   CrCl cannot be calculated (Patient's most recent lab result is older than the maximum 21 days allowed.).  CMP Latest Ref Rng & Units 02/08/2020 02/07/2020 02/07/2020  Glucose 70 - 99 mg/dL 02/09/2020) 253(G) 644(I)  BUN 8 - 23 mg/dL 15 14 14   Creatinine 0.61 - 1.24 mg/dL 347(Q 2.59  Sodium 135 - 145 mmol/L 139 140 140  Potassium 3.5 - 5.1 mmol/L 4.9 3.3(L) 3.5  Chloride 98 - 111 mmol/L 104 107 105  CO2 22 - 32 mmol/L 23 - 24  Calcium 8.9 - 10.3 mg/dL 9.1 - 9.5  Total Protein 6.5 - 8.1 g/dL - - 6.8  Total Bilirubin 0.3 - 1.2 mg/dL - - 0.5  Alkaline Phos 38 - 126 U/L - - 57  AST 15 - 41 U/L - - 45(H)  ALT 0 - 44 U/L - - 72(H)   CBC Latest Ref Rng & Units 02/08/2020 02/07/2020 02/07/2020  WBC 4.0 - 10.5 K/uL 8.0 - 10.3  Hemoglobin 13.0 - 17.0 g/dL 02/09/2020 02/09/2020 64.3  Hematocrit 39.0 - 52.0 % 42.8 40.0 45.0  Platelets 150 - 400 K/uL 275 - 302    Lipid Panel Recent Labs    02/07/20 2018  CHOL 116  TRIG 207*  LDLCALC 33  VLDL 41*  HDL 42  CHOLHDL 2.8    HEMOGLOBIN A1C Lab Results  Component Value Date   HGBA1C 8.1 (H) 02/07/2020   MPG 185.77 02/07/2020   TSH No results for input(s): TSH in the last 8760 hours.  External labs:  None  Allergies   Allergies  Allergen Reactions   Contrast Media [Iodinated Diagnostic Agents] Nausea And Vomiting   Rocephin [Ceftriaxone Sodium  In Dextrose] Nausea And Vomiting      Medications Prior to Visit:   Outpatient Medications Prior to Visit  Medication Sig Dispense Refill   aspirin 81 MG chewable tablet Chew 1 tablet (81 mg total) by mouth daily.     Canagliflozin-metFORMIN HCl (INVOKAMET PO) Take 1,000 mg by mouth 2 (two) times daily. 50-1000     carvedilol (COREG) 6.25 MG tablet Take 2 tablets (12.5 mg total) by mouth 2 (two) times daily. 180 tablet 3   ergocalciferol (VITAMIN D2) 1.25 MG (50000 UT) capsule Take  50,000 Units by mouth once a week. Sunday     insulin aspart (NOVOLOG FLEXPEN) 100 UNIT/ML FlexPen Inject 24 Units into the skin 3 (three) times daily with meals. And 10 units with a snack     insulin glargine, 2 Unit Dial, (TOUJEO MAX SOLOSTAR) 300 UNIT/ML Solostar Pen Inject 60 Units into the skin daily.     losartan (COZAAR) 25 MG tablet Take 1 tablet (25 mg total) by mouth every evening. 90 tablet 3   nitroGLYCERIN (NITROSTAT) 0.4 MG SL tablet Place 1 tablet (0.4 mg total) under the tongue every 5 (five) minutes as needed for chest pain. 25 tablet 1   nystatin ointment (MYCOSTATIN) Apply 1 application topically 2 (two) times daily as needed. Balanitis     pantoprazole (PROTONIX) 40 MG tablet Take 1 tablet by mouth daily.     rosuvastatin (CRESTOR) 20 MG tablet Take 20 mg by mouth at bedtime.     SUPER B COMPLEX/C PO Take 1 tablet by mouth daily.     ticagrelor (BRILINTA) 90 MG TABS tablet Take 1 tablet (90 mg total) by mouth 2 (two) times daily. 180 tablet 3   zolpidem (AMBIEN) 10 MG tablet Take 5 mg by mouth at bedtime as needed for sleep.   2   pantoprazole (PROTONIX) 40 MG tablet Take 40 mg by mouth daily.     No facility-administered medications prior to visit.     Final Medications at End of Visit    Current Meds  Medication Sig   aspirin 81 MG chewable tablet Chew 1 tablet (81 mg total) by mouth daily.   Canagliflozin-metFORMIN HCl (INVOKAMET PO) Take 1,000 mg by mouth 2 (two) times daily.  50-1000   carvedilol (COREG) 6.25 MG tablet Take 2 tablets (12.5 mg total) by mouth 2 (two) times daily.   ergocalciferol (VITAMIN D2) 1.25 MG (50000 UT) capsule Take 50,000 Units by mouth once a week. Sunday   insulin aspart (NOVOLOG FLEXPEN) 100 UNIT/ML FlexPen Inject 24 Units into the skin 3 (three) times daily with meals. And 10 units with a snack   insulin glargine, 2 Unit Dial, (TOUJEO MAX SOLOSTAR) 300 UNIT/ML Solostar Pen Inject 60 Units into the skin daily.   losartan (COZAAR) 25 MG tablet Take 1 tablet (25 mg total) by mouth every evening.   nitroGLYCERIN (NITROSTAT) 0.4 MG SL tablet Place 1 tablet (0.4 mg total) under the tongue every 5 (five) minutes as needed for chest pain.   nystatin ointment (MYCOSTATIN) Apply 1 application topically 2 (two) times daily as needed. Balanitis   pantoprazole (PROTONIX) 40 MG tablet Take 1 tablet by mouth daily.   rosuvastatin (CRESTOR) 20 MG tablet Take 20 mg by mouth at bedtime.   SUPER B COMPLEX/C PO Take 1 tablet by mouth daily.   ticagrelor (BRILINTA) 90 MG TABS tablet Take 1 tablet (90 mg total) by mouth 2 (two) times daily.   zolpidem (AMBIEN) 10 MG tablet Take 5 mg by mouth at bedtime as needed for sleep.    Radiology:   No results found. DG Chest Portable 1 View 02/07/2020 CLINICAL DATA:  Chest pain, dizziness, nausea EXAM: PORTABLE CHEST 1 VIEW COMPARISON:  03/03/2018 FINDINGS: Single frontal view of the chest demonstrates an unremarkable cardiac silhouette. No airspace disease, effusion, or pneumothorax. No acute bony abnormality.   Cardiac Studies:  Ambulatory cardiac telemetry 14 days (11/04/2020 - 11/18/2020): Predominant underlying rhythm was sinus.  Minimum heart rate 46 bpm, maximum heart rate 124 bpm, average heart rate 60 bpm.  Single episode of supraventricular tachycardia lasting 4 beats.  Frequent PACs (7.7% burden) and occasional PVCs (1.5% burden).  Single episode of PACs and PVCs were symptomatic, otherwise asymptomatic.   Ventricular bigeminy and trigeminy were present.  No evidence of ventricular tachycardia, atrial fibrillation, high degree AV block, or pauses >3 seconds.  PCV ECHOCARDIOGRAM COMPLETE 05/10/2020 Normal LV systolic function with visual EF 55-60%. Left ventricle cavity is normal in size. Normal global wall motion. Normal diastolic filling pattern, normal LAP. No significant valvular heart disease. Compared to prior study dated 02/08/2020: LVEF has improved from 40-45% to 55-60% with no regional wall motion abnormalities. Otherwise, no significant changes noted.   Coronary angiography and angioplasty 02/07/2020: RCA: Dominant, mild disease throughout. Left main: Smooth and normal. Circumflex: Moderate sized, mild diffuse disease. Ramus intermediate: Mild disease.  Small. LAD: Large vessel.  Occluded after the origin of a large D1.  Successful thrombectomy followed by overlapping 4.0 x 38 resolute Onyx and 3.5 x 12 mm Synergy DES deployed at 12 atmospheric pressure each, distal stent was postdilated with the same stent balloon at 16 atmospheric pressure at the overlap.  TIMI 0 to TIMI III flow improved, no evidence of edge dissection.  100 mL contrast utilized.   Recommendation: Patient will be observed in the intensive care unit.  He will be started on dual antiplatelet therapy and aggressive risk factor modification will be continued  EKG:   11/04/2020: Sinus rhythm at a rate of 66 bpm.  Left atrial enlargement.  Normal axis.  No evidence of ischemia or underlying injury pattern.  Compared to EKG 04/26/2020, no significant change.   EKG 02/15/2020: Sinus rhythm at a rate of 62 bpm, left atrial enlargement.  Normal axis.  Poor R wave progression, cannot exclude anteroseptal infarct old.  Nonspecific T wave abnormality.  Compared to EKG 02/08/2020, no significant change.  EKG 02/08/2020: Normal sinus rhythm/sinus bradycardia at rate of 59 bpm, normal axis, nonspecific T abnormality in the  anterolateral leads.   EKG 02/07/2020: Normal sinus rhythm at a rate of 76 bpm, hyperacute T waves changes in the anterolateral leads in V1 to V4 suggestive of acute STEMI in the anterolateral wall.  This is new compared to prior EKG from 2019.  Assessment     ICD-10-CM   1. Coronary artery disease involving native coronary artery of native heart without angina pectoris  I25.10     2. Chronic systolic (congestive) heart failure (HCC)  I50.22     3. Asymptomatic PVCs/PACs  I49.3        Medications Discontinued During This Encounter  Medication Reason   pantoprazole (PROTONIX) 40 MG tablet Duplicate    No orders of the defined types were placed in this encounter.   Recommendations:   Cory Sosa is a 65 y.o. male patient with hypertension, hyperlipidemia, diabetes mellitus, with recent ST elevation MI 02/07/2020 with successful PCI and stenting to the proximal and mid LAD.  Patient presents for 8-week follow-up after urgent visit with concerns of "blockage" in the eye from optometrist.  Further review of optometrist findings showed asymmetric diabetic retinopathy for which optometry requested carotid artery study.  Patient remains asymptomatic.  Reviewed and discussed with patient findings of ambulatory cardiac telemetry which revealed no significant cardiac arrhythmia, although patient does have frequent PACs and occasional PVCs he is relatively asymptomatic and echocardiogram has improved following PCI in 01/2020.  We will continue to monitor for symptoms of PACs/PVCs, recommend watchful waiting at this time.  Will call patient with  results of carotid artery duplex.  He is presently on aspirin and statin therapy, will continue this.  Blood pressure and lipids are under excellent control.  Follow-up in 6 months, sooner if needed, for CAD and hypertension.   Rayford Halsted, PA-C 12/02/2020, 2:10 PM Office: [phone]

## 2020-12-05 NOTE — Progress Notes (Signed)
Done

## 2020-12-08 ENCOUNTER — Ambulatory Visit: Payer: Medicare Other | Admitting: Student

## 2020-12-23 ENCOUNTER — Ambulatory Visit: Payer: Medicare Other | Admitting: Student

## 2021-02-08 ENCOUNTER — Telehealth: Payer: Self-pay

## 2021-02-08 NOTE — Telephone Encounter (Signed)
Pt called and stated that he was told to take brilinta for a year. He would like to know if he should continue this until the next office visit. He is about to run out if so. Please advise.

## 2021-02-08 NOTE — Telephone Encounter (Signed)
He may finish what he has and then stop, no need to refill

## 2021-02-09 NOTE — Telephone Encounter (Signed)
Called and spoke to pt, pt voiced understanding.

## 2021-04-27 ENCOUNTER — Other Ambulatory Visit: Payer: Self-pay | Admitting: Student

## 2021-04-27 DIAGNOSIS — I2510 Atherosclerotic heart disease of native coronary artery without angina pectoris: Secondary | ICD-10-CM

## 2021-04-27 DIAGNOSIS — I10 Essential (primary) hypertension: Secondary | ICD-10-CM

## 2021-05-04 ENCOUNTER — Telehealth: Payer: Self-pay

## 2021-05-04 NOTE — Telephone Encounter (Signed)
Attempted to call pt, number not in service. I also attempted to call pts wife, no answer left vm requesting call back.

## 2021-05-04 NOTE — Telephone Encounter (Signed)
Pt called and stated that he has been feeling some chest pain for the last few days. He stated it is in the center of his chest and it hurts when he breathes deeply. He is unsure if it is muscle or heart related. Pt has not taken his nitroglycerin. He said its a light pressure and when he breaths in, it is like a pinch. Pts BP has been normal and oxygen was 97%. Pt was taking ciprofloxacin and the chest pain started when he took that a few days ago. He took himself off this last night. Chest pain Is still there. He is unsure of what to do. Please advise.

## 2021-05-04 NOTE — Telephone Encounter (Signed)
Given that pain is with inspiration it is unlikely to be cardiac, more likely MSK. Please advise patient to continue to monitor and if pain worsens or has not resolved by Monday to let us know and we will make him an office visit. Also he should follow up with whoever prescribed the cipro if he feels it is related.

## 2021-05-04 NOTE — Telephone Encounter (Signed)
Pt called back, she voiced understanding and will be setting up an office visit with his PCP. He agreed to call us back if the pain does not go away by Monday.

## 2021-05-31 ENCOUNTER — Other Ambulatory Visit: Payer: Self-pay

## 2021-05-31 ENCOUNTER — Ambulatory Visit: Payer: Medicare Other | Admitting: Student

## 2021-05-31 ENCOUNTER — Encounter: Payer: Self-pay | Admitting: Student

## 2021-05-31 VITALS — BP 105/69 | HR 69 | Temp 98.3°F | Resp 17 | Ht 69.0 in | Wt 246.2 lb

## 2021-05-31 DIAGNOSIS — I10 Essential (primary) hypertension: Secondary | ICD-10-CM

## 2021-05-31 DIAGNOSIS — I493 Ventricular premature depolarization: Secondary | ICD-10-CM

## 2021-05-31 DIAGNOSIS — I2510 Atherosclerotic heart disease of native coronary artery without angina pectoris: Secondary | ICD-10-CM

## 2021-05-31 NOTE — Progress Notes (Signed)
Primary Physician/Referring:  Badger, Michael C, MD  Patient ID: Cory Sosa, male    DOB: 09/01/1955, 66 y.o.   MRN: [name]  Chief Complaint  Patient presents with   Coronary Artery Disease   Hypertension    6 month   HPI:    Cory Sosa  is a 66 y.o. male patient with hypertension, hyperlipidemia, diabetes mellitus, with recent ST elevation MI 02/07/2020 with successful PCI and stenting to the proximal and mid LAD.  Patient has been unable to tolerate metoprolol in the past due to frequent bowel movements.   Patient presents for 6 month follow up. 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 Carotid duplex revealed no significant stenosis.  Patient reports occasional brief episodes of chest pain lasting several seconds at rest.  Denies associated symptoms.  He tends to notice this most while driving or with deep inspiration.  Patient denies chest pain with exertion, dyspnea, syncope, near syncope, fatigue.  He does admit to more sedentary lifestyle over the last few months and has gained approximately 5 pounds since last office visit.  Past Medical History:  Diagnosis Date   Coronary artery disease    Diabetes mellitus without complication (HCC)    Hyperlipidemia    Hypertension    controlled with medications   Past Surgical History:  Procedure Laterality Date   CARDIAC CATHETERIZATION     CORONARY THROMBECTOMY N/A 02/07/2020   Procedure: Coronary Thrombectomy;  Surgeon: Yates DecampGanji, Jay, MD;  [facility];  Service: Cardiovascular;  Laterality: N/A;   CORONARY/GRAFT ACUTE MI REVASCULARIZATION N/A 02/07/2020   Procedure: Coronary/Graft Acute MI Revascularization;  Surgeon: Yates DecampGanji, Jay, MD;  Location: [facility] INVASIVE CV LAB;  Service: Cardiovascular;  Laterality: N/A;   LEFT HEART CATH AND CORONARY ANGIOGRAPHY N/A 02/07/2020   Procedure: LEFT HEART CATH AND CORONARY ANGIOGRAPHY;  Surgeon: Yates DecampGanji, Jay, MD;  Location: [facility]  INVASIVE CV LAB;  Service: Cardiovascular;  Laterality: N/A;   Family History  Problem Relation Age of Onset   Pneumonia Mother     Social History   Tobacco Use   Smoking status: Never   Smokeless tobacco: Never  Substance Use Topics   Alcohol use: Never   Marital Status: Married   ROS  Review of Systems  Constitutional: Negative for malaise/fatigue.  Cardiovascular:  Positive for chest pain (occasional, breif, at rest). Negative for claudication, leg swelling, near-syncope, orthopnea, palpitations, paroxysmal nocturnal dyspnea and syncope.  Respiratory:  Negative for shortness of breath.   Neurological:  Negative for dizziness.   Objective  Blood pressure 105/69, pulse 69, temperature 98.3 F (36.8 C), temperature source Temporal, resp. rate 17, height 5\' 9"  (1.753 m), weight 246 lb 3.2 oz (111.7 kg), SpO2 97 %.  Vitals with BMI 05/31/2021 12/01/2020 11/04/2020  Height 5\' 9"  5\' 9"  5\' 9"   Weight 246 lbs 3 oz 241 lbs 240 lbs  BMI 36.34 35.57 35.43  Systolic 105 115 [id number]  Diastolic 69 75 78  Pulse 69 63 73      Physical Exam Vitals reviewed.  Constitutional:      Appearance: He is obese.  Cardiovascular:     Rate and Rhythm: Normal rate and regular rhythm. No extrasystoles are present.    Pulses: Intact distal pulses.     Heart sounds: S1 normal and S2 normal. No murmur heard.   No gallop.     Comments: No JVD Pulmonary:     Effort: Pulmonary effort is normal. No respiratory distress.  Breath sounds: No wheezing, rhonchi or rales.  Musculoskeletal:     Right lower leg: No edema.     Left lower leg: No edema.  Neurological:     Mental Status: He is alert.   Laboratory examination:   No results for input(s): NA, K, CL, CO2, GLUCOSE, BUN, CREATININE, CALCIUM, GFRNONAA, GFRAA in the last 8760 hours.  CrCl cannot be calculated (Patient's most recent lab result is older than the maximum 21 days allowed.).  CMP Latest Ref Rng & Units 02/08/2020 02/07/2020 02/07/2020   Glucose 70 - 99 mg/dL 811(B) 147(W) 295(A)  BUN 8 - 23 mg/dL Creatinine 0.61 - 1.24 mg/dL 2.13 0.86 5.78  Sodium 135 - 145 mmol/L 139 140 140  Potassium 3.5 - 5.1 mmol/L 4.9 3.3(L) 3.5  Chloride 98 - 111 mmol/L 104 107 105  CO2 22 - 32 mmol/L 23 - 24  Calcium 8.9 - 10.3 mg/dL 9.1 - 9.5  Total Protein 6.5 - 8.1 g/dL - - 6.8  Total Bilirubin 0.3 - 1.2 mg/dL - - 0.5  Alkaline Phos 38 - 126 U/L - - 57  AST 15 - 41 U/L - - 45(H)  ALT 0 - 44 U/L - - 72(H)   CBC Latest Ref Rng & Units 02/08/2020 02/07/2020 02/07/2020  WBC 4.0 - 10.5 K/uL 8.0 - 10.3  Hemoglobin 13.0 - 17.0 g/dL 46.9 62.9 52.8  Hematocrit 39.0 - 52.0 % 42.8 40.0 45.0  Platelets 150 - 400 K/uL 275 - 302    Lipid Panel No results for input(s): CHOL, TRIG, LDLCALC, VLDL, HDL, CHOLHDL, LDLDIRECT in the last 8760 hours.   HEMOGLOBIN A1C Lab Results  Component Value Date   HGBA1C 8.1 (H) 02/07/2020   MPG 185.77 02/07/2020   TSH No results for input(s): TSH in the last 8760 hours.  External labs:  None   Allergies   Allergies  Allergen Reactions   Contrast Media [Iodinated Contrast Media] Nausea And Vomiting   Rocephin [Ceftriaxone Sodium In Dextrose] Nausea And Vomiting    Medications Prior to Visit:   Outpatient Medications Prior to Visit  Medication Sig Dispense Refill   albuterol (VENTOLIN HFA) 108 (90 Base) MCG/ACT inhaler Inhale 1 puff into the lungs daily as needed.     aspirin 81 MG chewable tablet Chew 1 tablet (81 mg total) by mouth daily.     Canagliflozin-metFORMIN HCl (INVOKAMET PO) Take 1,000 mg by mouth 2 (two) times daily. 50-1000     carvedilol (COREG) 6.25 MG tablet Take 2 tablets (12.5 mg total) by mouth 2 (two) times daily. 180 tablet 3   ergocalciferol (VITAMIN D2) 1.25 MG (50000 UT) capsule Take 50,000 Units by mouth once a week. Sunday     insulin aspart (NOVOLOG FLEXPEN) 100 UNIT/ML FlexPen Inject 24 Units into the skin 3 (three) times daily with meals. And 10 units with a  snack     insulin glargine, 2 Unit Dial, (TOUJEO MAX SOLOSTAR) 300 UNIT/ML Solostar Pen Inject 60 Units into the skin daily.     losartan (COZAAR) 25 MG tablet TAKE 1 TABLET EVERY EVENING 90 tablet 3   nitroGLYCERIN (NITROSTAT) 0.4 MG SL tablet Place 1 tablet (0.4 mg total) under the tongue every 5 (five) minutes as needed for chest pain. 25 tablet 1   nystatin ointment (MYCOSTATIN) Apply 1 application topically 2 (two) times daily as needed. Balanitis     pantoprazole (PROTONIX) 40 MG tablet Take 1 tablet by mouth daily.     rosuvastatin (  CRESTOR) 20 MG tablet Take 20 mg by mouth at bedtime.     SUPER B COMPLEX/C PO Take 1 tablet by mouth daily.     zolpidem (AMBIEN) 10 MG tablet Take 5 mg by mouth at bedtime as needed for sleep.   2   ticagrelor (BRILINTA) 90 MG TABS tablet Take 1 tablet (90 mg total) by mouth 2 (two) times daily. 180 tablet 3   No facility-administered medications prior to visit.   Final Medications at End of Visit    Current Meds  Medication Sig   albuterol (VENTOLIN HFA) 108 (90 Base) MCG/ACT inhaler Inhale 1 puff into the lungs daily as needed.   aspirin 81 MG chewable tablet Chew 1 tablet (81 mg total) by mouth daily.   Canagliflozin-metFORMIN HCl (INVOKAMET PO) Take 1,000 mg by mouth 2 (two) times daily. 50-1000   carvedilol (COREG) 6.25 MG tablet Take 2 tablets (12.5 mg total) by mouth 2 (two) times daily.   ergocalciferol (VITAMIN D2) 1.25 MG (50000 UT) capsule Take 50,000 Units by mouth once a week. Sunday   insulin aspart (NOVOLOG FLEXPEN) 100 UNIT/ML FlexPen Inject 24 Units into the skin 3 (three) times daily with meals. And 10 units with a snack   insulin glargine, 2 Unit Dial, (TOUJEO MAX SOLOSTAR) 300 UNIT/ML Solostar Pen Inject 60 Units into the skin daily.   losartan (COZAAR) 25 MG tablet TAKE 1 TABLET EVERY EVENING   nitroGLYCERIN (NITROSTAT) 0.4 MG SL tablet Place 1 tablet (0.4 mg total) under the tongue every 5 (five) minutes as needed for chest pain.    nystatin ointment (MYCOSTATIN) Apply 1 application topically 2 (two) times daily as needed. Balanitis   pantoprazole (PROTONIX) 40 MG tablet Take 1 tablet by mouth daily.   rosuvastatin (CRESTOR) 20 MG tablet Take 20 mg by mouth at bedtime.   SUPER B COMPLEX/C PO Take 1 tablet by mouth daily.   zolpidem (AMBIEN) 10 MG tablet Take 5 mg by mouth at bedtime as needed for sleep.    Radiology:   No results found. DG Chest Portable 1 View 02/07/2020 CLINICAL DATA:  Chest pain, dizziness, nausea EXAM: PORTABLE CHEST 1 VIEW COMPARISON:  03/03/2018 FINDINGS: Single frontal view of the chest demonstrates an unremarkable cardiac silhouette. No airspace disease, effusion, or pneumothorax. No acute bony abnormality.   Cardiac Studies:   Carotid artery duplex December 06, 2020:  No hemodynamically significant arterial disease in the internal carotid artery bilaterally.  Very mild soft plaque noted in the bilateral CCA.  Antegrade right vertebral artery flow. Antegrade left vertebral artery flow.  Ambulatory cardiac telemetry 14 days (11/04/2020 - 11/18/2020): Predominant underlying rhythm was sinus.  Minimum heart rate 46 bpm, maximum heart rate 124 bpm, average heart rate 60 bpm.  Single episode of supraventricular tachycardia lasting 4 beats.  Frequent PACs (7.7% burden) and occasional PVCs (1.5% burden).  Single episode of PACs and PVCs were symptomatic, otherwise asymptomatic.  Ventricular bigeminy and trigeminy were present.  No evidence of ventricular tachycardia, atrial fibrillation, high degree AV block, or pauses >3 seconds.  PCV ECHOCARDIOGRAM COMPLETE 05/10/2020 Normal LV systolic function with visual EF 55-60%. Left ventricle cavity is normal in size. Normal global wall motion. Normal diastolic filling pattern, normal LAP. No significant valvular heart disease. Compared to prior study dated 02/08/2020: LVEF has improved from 40-45% to 55-60% with no regional wall motion abnormalities. Otherwise, no  significant changes noted.   Coronary angiography and angioplasty 02/07/2020: RCA: Dominant, mild disease throughout. Left main: Smooth and normal. Circumflex:  Moderate sized, mild diffuse disease. Ramus intermediate: Mild disease.  Small. LAD: Large vessel.  Occluded after the origin of a large D1.  Successful thrombectomy followed by overlapping 4.0 x 38 resolute Onyx and 3.5 x 12 mm Synergy DES deployed at 12 atmospheric pressure each, distal stent was postdilated with the same stent balloon at 16 atmospheric pressure at the overlap.  TIMI 0 to TIMI III flow improved, no evidence of edge dissection.  100 mL contrast utilized.   Recommendation: Patient will be observed in the intensive care unit.  He will be started on dual antiplatelet therapy and aggressive risk factor modification will be continued  EKG:  05/31/2021: Sinus rhythm at a rate of 64 bpm.  Normal axis.  Left atrial enlargement.  No evidence of ischemia or underlying injury pattern.  Compared EKG 11/04/2020, no significant change.   EKG 02/15/2020: Sinus rhythm at a rate of 62 bpm, left atrial enlargement.  Normal axis.  Poor R wave progression, cannot exclude anteroseptal infarct old.  Nonspecific T wave abnormality.  Compared to EKG 02/08/2020, no significant change.  EKG 02/08/2020: Normal sinus rhythm/sinus bradycardia at rate of 59 bpm, normal axis, nonspecific T abnormality in the anterolateral leads.   EKG 02/07/2020: Normal sinus rhythm at a rate of 76 bpm, hyperacute T waves changes in the anterolateral leads in V1 to V4 suggestive of acute STEMI in the anterolateral wall.  This is new compared to prior EKG from 2019.  Assessment     ICD-10-CM   1. Coronary artery disease involving native coronary artery of native heart without angina pectoris  I25.10     2. Essential hypertension  I10 EKG 12-Lead    3. Asymptomatic PVCs/PACs  I49.3        Medications Discontinued During This Encounter  Medication Reason    ticagrelor (BRILINTA) 90 MG TABS tablet Discontinued by provider    No orders of the defined types were placed in this encounter.    Recommendations:   Cory Sosa is a 66 y.o. male patient with hypertension, hyperlipidemia, diabetes mellitus, with recent ST elevation MI 02/07/2020 with successful PCI and stenting to the proximal and mid LAD.  Patient presents for 6 month follow up. 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 Carotid duplex revealed no significant stenosis.  Patient's blood pressure is well controlled.  He is due for repeat lipid profile testing, will defer to PCP but request the records be shared with our office.  Patient's physical exam is unchanged compared to previous and EKG is without ischemic changes.  Patient's chest pain symptoms are consistent with musculoskeletal etiology, advised patient regarding conservative measures and watchful waiting.  Counseled him regarding signs and symptoms that would warrant urgent or emergent evaluation, patient verbalized understanding agreement.    He is tolerating present medications without issue, continue aspirin, carvedilol, losartan, rosuvastatin.  Notably his blood pressure is soft at today's office visit, however he is asymptomatic and PCP previously reduced antihypertensive medications which uncontrolled hypertension.  Reiterated the importance of active lifestyle and dietary compliance, patient appears motivated to increase physical activity.  Follow-up in 1 year, sooner if needed.    Cory Halsted, PA-C 05/31/2021, 10:10 AM Office: [phone]

## 2022-03-05 ENCOUNTER — Other Ambulatory Visit: Payer: Self-pay

## 2022-03-05 MED ORDER — NITROGLYCERIN 0.4 MG SL SUBL: 0.4000 mg | SUBLINGUAL_TABLET | SUBLINGUAL | 1 refills | Status: AC | PRN

## 2022-03-26 DIAGNOSIS — K4020 Bilateral inguinal hernia, without obstruction or gangrene, not specified as recurrent: Secondary | ICD-10-CM

## 2022-03-26 HISTORY — DX: Bilateral inguinal hernia, without obstruction or gangrene, not specified as recurrent: K40.20

## 2022-06-01 ENCOUNTER — Ambulatory Visit: Payer: Medicare Other | Admitting: Student

## 2022-06-05 ENCOUNTER — Ambulatory Visit: Payer: Medicare Other | Admitting: Cardiology

## 2022-06-08 ENCOUNTER — Encounter: Payer: Self-pay | Admitting: Cardiology

## 2022-06-08 ENCOUNTER — Ambulatory Visit: Payer: Medicare Other | Admitting: Cardiology

## 2022-06-08 VITALS — BP 116/76 | HR 68 | Resp 18 | Ht 69.0 in | Wt 245.6 lb

## 2022-06-08 DIAGNOSIS — R072 Precordial pain: Secondary | ICD-10-CM

## 2022-06-08 DIAGNOSIS — I25118 Atherosclerotic heart disease of native coronary artery with other forms of angina pectoris: Secondary | ICD-10-CM

## 2022-06-08 DIAGNOSIS — I252 Old myocardial infarction: Secondary | ICD-10-CM

## 2022-06-08 DIAGNOSIS — E1169 Type 2 diabetes mellitus with other specified complication: Secondary | ICD-10-CM

## 2022-06-08 DIAGNOSIS — I10 Essential (primary) hypertension: Secondary | ICD-10-CM

## 2022-06-08 DIAGNOSIS — E1165 Type 2 diabetes mellitus with hyperglycemia: Secondary | ICD-10-CM

## 2022-06-08 NOTE — Progress Notes (Signed)
Primary Physician/Referring:  Chesley Noon, MD  Patient ID: Cory Sosa, male    DOB: 06/29/1955, 66 y.o.   MRN: [id number]  Chief Complaint  Patient presents with   Hypertension   Coronary Artery Disease   Hyperlipidemia   Follow-up    1 year   HPI:    Cory Sosa  is a 67 y.o. male patient with hypertension, hyperlipidemia, diabetes mellitus, HX of STEMI s/p PCI to proximal to mid LAD, obesity.   Patient have a history of anterior STEMI requiring PCI to the proximal to mid LAD back in November 2021.  He presents today for 1 year follow-up visit.  Since last office visit he is doing well overall with the exception of substernal chest pain at times.  He states that he has discomfort couple times a month, at times brought on by effort related activities, improves with resting and/or exposure to cool air.  The symptoms are not equivalent to his anginal discomfort back in 2021.  No use of sublingual nitroglycerin tablet.  He continues to walk 2 to 3 miles 3 times a week.  Past Medical History:  Diagnosis Date   Coronary artery disease    Diabetes mellitus without complication ([place])    Hyperlipidemia    Hypertension    controlled with medications   Past Surgical History:  Procedure Laterality Date   CARDIAC CATHETERIZATION     CORONARY THROMBECTOMY N/A 02/07/2020   Procedure: Coronary Thrombectomy;  Surgeon: Adrian Prows, MD;  Location: [facility];  Service: Cardiovascular;  Laterality: N/A;   CORONARY/GRAFT ACUTE MI REVASCULARIZATION N/A 02/07/2020   Procedure: Coronary/Graft Acute MI Revascularization;  Surgeon: Adrian Prows, MD;  Location: [facility];  Service: Cardiovascular;  Laterality: N/A;   LEFT HEART CATH AND CORONARY ANGIOGRAPHY N/A 02/07/2020   Procedure: LEFT HEART CATH AND CORONARY ANGIOGRAPHY;  Surgeon: Adrian Prows, MD;  Location: [facility];  Service: Cardiovascular;  Laterality: N/A;   Family History  Problem Relation Age of Onset    Pneumonia Mother     Social History   Tobacco Use   Smoking status: Never   Smokeless tobacco: Never  Substance Use Topics   Alcohol use: Never   Marital Status: Married   ROS  Review of Systems  Constitutional: Negative for malaise/fatigue.  Cardiovascular:  Positive for chest pain (occasional, breif, at rest). Negative for claudication, leg swelling, near-syncope, orthopnea, palpitations, paroxysmal nocturnal dyspnea and syncope.  Respiratory:  Negative for shortness of breath.   Neurological:  Negative for dizziness.    Objective  Blood pressure 116/76, pulse 68, resp. rate 18, height 5\' 9"  (1.753 m), weight 245 lb 9.6 oz (111.4 kg), SpO2 94 %.     06/08/2022   10:28 AM 05/31/2021    8:48 AM 12/01/2020    9:35 AM  Vitals with BMI  Height 5\' 9"  5\' 9"  5\' 9"   Weight 245 lbs 10 oz 246 lbs 3 oz 241 lbs  BMI 36.25 [id number] [id number]  Systolic [id number]  Diastolic 76 69 75  Pulse 68 69 63    Physical Exam  Constitutional: No distress.  Age appropriate, hemodynamically stable.   Neck: No JVD present.  Cardiovascular: Normal rate, regular rhythm, S1 normal, S2 normal, intact distal pulses and normal pulses. Exam reveals no gallop, no S3 and no S4.  No murmur heard. Pulses:      Dorsalis pedis pulses are 2+ on the right side and 2+ on the left side.  Posterior tibial pulses are 2+ on the right side and 2+ on the left side.  Pulmonary/Chest: Effort normal and breath sounds normal. No stridor. He has no wheezes. He has no rales.  Abdominal: Soft. Bowel sounds are normal. He exhibits no distension. There is no abdominal tenderness.  obese  Musculoskeletal:        General: No edema.     Cervical back: Neck supple.  Neurological: He is alert and oriented to person, place, and time. He has intact cranial nerves (2-12).  Skin: Skin is warm and moist.     Laboratory examination:  External labs:  Component 04/26/22 02/22/21 11/17/20 04/28/20 11/17/19 04/22/19  Cholesterol, Total  121 105 104 99 Low  123 101  Triglycerides 95 308 High  242 High  130 208 High  165 High   HDL 48 39 Low  43 42 -- 40  VLDL Cholesterol Cal 18 44 High  37 23 -- 27  LDL 55 22 24 34 -- 34   Component 04/26/22 11/29/21 05/24/21 11/17/20 04/28/20 03/22/20  Glucose 78 143 High  164 High  187 High  166 High  --  BUN 15 16 14 17 12  --  Creatinine 0.93 0.93 0.82 1.03 0.95 --  eGFR 90 91 97 81 -- --  BUN/Creatinine Ratio 16 17 17 17 13  --  Sodium 142 141 143 141 140 --  Potassium 4.1 4.7 4.5 4.3 4.7 --  Chloride 102 104 105 103 103 --  CO2 22 21 24 20 23  --  CALCIUM 10.2 10.1 9.9 10.1 9.9 --  Total Protein 7.1 7.1 6.9 7.0 7.0 7.3  Albumin, Serum 4.4 4.5 4.5 4.7 4.4 4.6  Globulin, Total 2.7 2.6 2.4 2.3 2.6 --  Albumin/Globulin Ratio 1.6 1.7 1.9 2.0 1.7 --  Total Bilirubin 0.5 0.4 0.2 0.3 0.3 0.4  Alkaline Phosphatase 69 67 68 72 66 71   AST 35 50 High  29 28 32 50 High   ALT (SGPT) 54 High  77 High  55 High  45 High  46 High  79 High     Allergies   Allergies  Allergen Reactions   Contrast Media [Iodinated Contrast Media] Nausea And Vomiting   Rocephin [Ceftriaxone Sodium In Dextrose] Nausea And Vomiting    Medications Prior to Visit:   Outpatient Medications Prior to Visit  Medication Sig Dispense Refill   albuterol (VENTOLIN HFA) 108 (90 Base) MCG/ACT inhaler Inhale 1 puff into the lungs daily as needed.     aspirin 81 MG chewable tablet Chew 1 tablet (81 mg total) by mouth daily.     Canagliflozin-metFORMIN HCl (INVOKAMET PO) Take 1,000 mg by mouth 2 (two) times daily. 50-1000     carvedilol (COREG) 6.25 MG tablet Take 2 tablets (12.5 mg total) by mouth 2 (two) times daily. 180 tablet 3   ergocalciferol (VITAMIN D2) 1.25 MG (50000 UT) capsule Take 50,000 Units by mouth once a week. Sunday     insulin aspart (NOVOLOG FLEXPEN) 100 UNIT/ML FlexPen Inject 24 Units into the skin 3 (three) times daily with meals. And 10 units with a snack     insulin glargine, 2 Unit Dial, (TOUJEO MAX  SOLOSTAR) 300 UNIT/ML Solostar Pen Inject 60 Units into the skin daily.     losartan (COZAAR) 25 MG tablet TAKE 1 TABLET EVERY EVENING 90 tablet 3   nitroGLYCERIN (NITROSTAT) 0.4 MG SL tablet Place 1 tablet (0.4 mg total) under the tongue every 5 (five) minutes as needed for chest pain.  25 tablet 1   nystatin ointment (MYCOSTATIN) Apply 1 application topically 2 (two) times daily as needed. Balanitis     pantoprazole (PROTONIX) 40 MG tablet Take 1 tablet by mouth daily.     rosuvastatin (CRESTOR) 20 MG tablet Take 20 mg by mouth at bedtime.     SUPER B COMPLEX/C PO Take 1 tablet by mouth daily.     zolpidem (AMBIEN) 10 MG tablet Take 5 mg by mouth at bedtime as needed for sleep.   2   No facility-administered medications prior to visit.   Final Medications at End of Visit    Current Meds  Medication Sig   albuterol (VENTOLIN HFA) 108 (90 Base) MCG/ACT inhaler Inhale 1 puff into the lungs daily as needed.   aspirin 81 MG chewable tablet Chew 1 tablet (81 mg total) by mouth daily.   Canagliflozin-metFORMIN HCl (INVOKAMET PO) Take 1,000 mg by mouth 2 (two) times daily. 50-1000   carvedilol (COREG) 6.25 MG tablet Take 2 tablets (12.5 mg total) by mouth 2 (two) times daily.   ergocalciferol (VITAMIN D2) 1.25 MG (50000 UT) capsule Take 50,000 Units by mouth once a week. Sunday   insulin aspart (NOVOLOG FLEXPEN) 100 UNIT/ML FlexPen Inject 24 Units into the skin 3 (three) times daily with meals. And 10 units with a snack   insulin glargine, 2 Unit Dial, (TOUJEO MAX SOLOSTAR) 300 UNIT/ML Solostar Pen Inject 60 Units into the skin daily.   losartan (COZAAR) 25 MG tablet TAKE 1 TABLET EVERY EVENING   nitroGLYCERIN (NITROSTAT) 0.4 MG SL tablet Place 1 tablet (0.4 mg total) under the tongue every 5 (five) minutes as needed for chest pain.   nystatin ointment (MYCOSTATIN) Apply 1 application topically 2 (two) times daily as needed. Balanitis   pantoprazole (PROTONIX) 40 MG tablet Take 1 tablet by mouth  daily.   rosuvastatin (CRESTOR) 20 MG tablet Take 20 mg by mouth at bedtime.   SUPER B COMPLEX/C PO Take 1 tablet by mouth daily.   zolpidem (AMBIEN) 10 MG tablet Take 5 mg by mouth at bedtime as needed for sleep.    Radiology:   No results found. DG Chest Portable 1 View 02/07/2020 CLINICAL DATA:  Chest pain, dizziness, nausea EXAM: PORTABLE CHEST 1 VIEW COMPARISON:  03/03/2018 FINDINGS: Single frontal view of the chest demonstrates an unremarkable cardiac silhouette. No airspace disease, effusion, or pneumothorax. No acute bony abnormality.   Cardiac Studies:   Carotid artery duplex 02-Dec-2020:  No hemodynamically significant arterial disease in the internal carotid artery bilaterally.  Very mild soft plaque noted in the bilateral CCA.  Antegrade right vertebral artery flow. Antegrade left vertebral artery flow.  Ambulatory cardiac telemetry 14 days (11/04/2020 - 11/18/2020): Predominant underlying rhythm was sinus.  Minimum heart rate 46 bpm, maximum heart rate 124 bpm, average heart rate 60 bpm.  Single episode of supraventricular tachycardia lasting 4 beats.  Frequent PACs (7.7% burden) and occasional PVCs (1.5% burden).  Single episode of PACs and PVCs were symptomatic, otherwise asymptomatic.  Ventricular bigeminy and trigeminy were present.  No evidence of ventricular tachycardia, atrial fibrillation, high degree AV block, or pauses >3 seconds.  PCV ECHOCARDIOGRAM COMPLETE 0000000 Normal LV systolic function with visual EF 55-60%. Left ventricle cavity is normal in size. Normal global wall motion. Normal diastolic filling pattern, normal LAP. No significant valvular heart disease. Compared to prior study dated 02/08/2020: LVEF has improved from 40-45% to 55-60% with no regional wall motion abnormalities. Otherwise, no significant changes noted.   Coronary  angiography and angioplasty 02/07/2020: RCA: Dominant, mild disease throughout. Left main: Smooth and normal. Circumflex:  Moderate sized, mild diffuse disease. Ramus intermediate: Mild disease.  Small. LAD: Large vessel.  Occluded after the origin of a large D1.  Successful thrombectomy followed by overlapping 4.0 x 38 resolute Onyx and 3.5 x 12 mm Synergy DES deployed at 12 atmospheric pressure each, distal stent was postdilated with the same stent balloon at 16 atmospheric pressure at the overlap.  TIMI 0 to TIMI III flow improved, no evidence of edge dissection.  100 mL contrast utilized.   Recommendation: Patient will be observed in the intensive care unit.  He will be started on dual antiplatelet therapy and aggressive risk factor modification will be continued  EKG:  06/08/2022: Sinus rhythm, 62 bpm, without underlying ischemia or injury pattern.  Assessment     ICD-10-CM   1. 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 Precordial pain  R07.2 PCV ECHOCARDIOGRAM COMPLETE    PCV MYOCARDIAL PERFUSION WO LEXISCAN    3. Hx of ST elevation myocardial infarction  I25.2 PCV ECHOCARDIOGRAM COMPLETE    PCV MYOCARDIAL PERFUSION WO LEXISCAN    4. Essential hypertension  I10     5. Type 2 diabetes mellitus with hyperglycemia, with long-term current use of insulin (HCC)  E11.65    Z79.4     6. Type 2 diabetes mellitus with hyperlipidemia ([facility])  E11.69    E78.5     7. Class 2 severe obesity due to excess calories with serious comorbidity and body mass index (BMI) of 36.0 to 36.9 in adult [facility])  E66.01    Z68.36        There are no discontinued medications.   No orders of the defined types were placed in this encounter.    Recommendations:   Cory Sosa is a 67 y.o. male patient with hypertension, hyperlipidemia, diabetes mellitus, HX of STEMI s/p PCI to proximal to mid LAD, obesity.   Coronary artery disease involving native coronary  artery of native heart with other form of angina pectoris Precordial pain Hx of ST elevation myocardial infarction Precordial discomfort suggestive of possible cardiac etiology based on symptoms. Multiple cardiovascular risk factors as outlined above. EKG today is nonischemic. No use of sublingual nitroglycerin tablets. Echo will be ordered to evaluate for structural heart disease and left ventricular systolic function. Exercise treadmill stress test to evaluate for reversible ischemia and functional capacity Most recent LDL at goal 55 mg/dL. Reemphasized the importance of glycemic control. Patient states that he has tried Ozempic in the past but did not do well.  I have asked him to discuss this further with endocrinology and reconsider retrial given the cardio vascular benefits.  He likely will need a reduction in insulin and also reduction in food intake by 30% when initiating this medication.  Patient states that he will revisit this with his endocrinologist.  Essential hypertension Office blood pressures are well-controlled. Medications reconciled.   No changes warranted at this time  Type 2 diabetes mellitus with hyperglycemia, with long-term current use of insulin ([place]) Reemphasized importance of glycemic control. Currently on ARB, statin therapy, Invokana.   Type 2 diabetes mellitus with hyperlipidemia (HCC) Currently on rosuvastatin.   He denies myalgia or other side effects. Most recent lipids dated February 2024, independently reviewed as noted  above.  Class 2 severe obesity due to excess calories with serious comorbidity and body mass index (BMI) of 36.0 to 36.9 in adult [facility]) Body mass index is 36.27 kg/m. I reviewed with him importance of diet, regular physical activity/exercise, weight loss.   Patient is educated on the importance of increasing physical activity gradually as tolerated with a goal of moderate intensity exercise for 30 minutes a day 5 days a week.

## 2022-06-15 ENCOUNTER — Ambulatory Visit (HOSPITAL_COMMUNITY)
Admission: RE | Disposition: A | Discharge status: Home / Self Care | Payer: Self-pay | Source: Ambulatory Visit | Attending: Cardiology

## 2022-06-15 ENCOUNTER — Encounter: Payer: Self-pay | Admitting: Internal Medicine

## 2022-06-15 ENCOUNTER — Ambulatory Visit (HOSPITAL_COMMUNITY)
Admission: RE | Admit: 2022-06-15 | Discharge: 2022-06-15 | Disposition: A | Discharge status: Home / Self Care | Payer: Medicare Other | Source: Ambulatory Visit | Attending: Cardiology | Admitting: Cardiology

## 2022-06-15 ENCOUNTER — Ambulatory Visit: Payer: Medicare Other | Admitting: Internal Medicine

## 2022-06-15 ENCOUNTER — Other Ambulatory Visit: Payer: Self-pay

## 2022-06-15 ENCOUNTER — Telehealth: Payer: Self-pay

## 2022-06-15 VITALS — BP 126/83 | HR 69 | Ht 69.0 in | Wt 246.0 lb

## 2022-06-15 DIAGNOSIS — Z7984 Long term (current) use of oral hypoglycemic drugs: Secondary | ICD-10-CM | POA: Diagnosis not present

## 2022-06-15 DIAGNOSIS — Z955 Presence of coronary angioplasty implant and graft: Secondary | ICD-10-CM | POA: Diagnosis not present

## 2022-06-15 DIAGNOSIS — E669 Obesity, unspecified: Secondary | ICD-10-CM | POA: Diagnosis not present

## 2022-06-15 DIAGNOSIS — I200 Unstable angina: Secondary | ICD-10-CM

## 2022-06-15 DIAGNOSIS — I25118 Atherosclerotic heart disease of native coronary artery with other forms of angina pectoris: Secondary | ICD-10-CM | POA: Insufficient documentation

## 2022-06-15 DIAGNOSIS — Z794 Long term (current) use of insulin: Secondary | ICD-10-CM | POA: Diagnosis not present

## 2022-06-15 DIAGNOSIS — E119 Type 2 diabetes mellitus without complications: Secondary | ICD-10-CM | POA: Diagnosis not present

## 2022-06-15 DIAGNOSIS — I10 Essential (primary) hypertension: Secondary | ICD-10-CM | POA: Diagnosis not present

## 2022-06-15 DIAGNOSIS — Z79899 Other long term (current) drug therapy: Secondary | ICD-10-CM | POA: Insufficient documentation

## 2022-06-15 DIAGNOSIS — E785 Hyperlipidemia, unspecified: Secondary | ICD-10-CM | POA: Insufficient documentation

## 2022-06-15 DIAGNOSIS — Z6836 Body mass index (BMI) 36.0-36.9, adult: Secondary | ICD-10-CM | POA: Diagnosis not present

## 2022-06-15 DIAGNOSIS — I252 Old myocardial infarction: Secondary | ICD-10-CM | POA: Insufficient documentation

## 2022-06-15 DIAGNOSIS — R072 Precordial pain: Secondary | ICD-10-CM | POA: Diagnosis present

## 2022-06-15 DIAGNOSIS — R079 Chest pain, unspecified: Secondary | ICD-10-CM

## 2022-06-15 HISTORY — PX: LEFT HEART CATH AND CORONARY ANGIOGRAPHY: CATH118249

## 2022-06-15 LAB — BASIC METABOLIC PANEL
Anion gap: 11 (ref 5–15)
BUN: 13 mg/dL (ref 8–23)
CO2: 24 mmol/L (ref 22–32)
Calcium: 9.4 mg/dL (ref 8.9–10.3)
Chloride: 104 mmol/L (ref 98–111)
Creatinine, Ser: 1.02 mg/dL (ref 0.61–1.24)
GFR, Estimated: >60 mL/min (ref >60)
Glucose, Bld: 135 mg/dL — ABNORMAL HIGH (ref 70–99)
Potassium: 5.7 mmol/L — ABNORMAL HIGH (ref 3.5–5.1)
Sodium: 139 mmol/L (ref 135–145)

## 2022-06-15 LAB — POCT I-STAT, CHEM 8
BUN: 13 mg/dL (ref 8–23)
Calcium, Ion: 1.23 mmol/L (ref 1.15–1.40)
Chloride: 103 mmol/L (ref 98–111)
Creatinine, Ser: 1 mg/dL (ref 0.61–1.24)
Glucose, Bld: 123 mg/dL — ABNORMAL HIGH (ref 70–99)
HCT: 44 % (ref 39.0–52.0)
Hemoglobin: 15 g/dL (ref 13.0–17.0)
Potassium: 4.3 mmol/L (ref 3.5–5.1)
Sodium: 141 mmol/L (ref 135–145)
TCO2: 25 mmol/L (ref 22–32)

## 2022-06-15 LAB — CBC
HCT: 47.3 % (ref 39.0–52.0)
Hemoglobin: 15.5 g/dL (ref 13.0–17.0)
MCH: 30.5 pg (ref 26.0–34.0)
MCHC: 32.8 g/dL (ref 30.0–36.0)
MCV: 92.9 fL (ref 80.0–100.0)
Platelets: 299 10*3/uL (ref 150–400)
RBC: 5.09 MIL/uL (ref 4.22–5.81)
RDW: 14.7 % (ref 11.5–15.5)
WBC: 8.2 10*3/uL (ref 4.0–10.5)
nRBC: 0 % (ref 0.0–0.2)

## 2022-06-15 LAB — TROPONIN I (HIGH SENSITIVITY): Troponin I (High Sensitivity): 2 ng/L (ref <18)

## 2022-06-15 LAB — GLUCOSE, CAPILLARY: Glucose-Capillary: 146 mg/dL — ABNORMAL HIGH (ref 70–99)

## 2022-06-15 SURGERY — LEFT HEART CATH AND CORONARY ANGIOGRAPHY
Anesthesia: LOCAL

## 2022-06-15 MED ORDER — SODIUM CHLORIDE 0.9% FLUSH: 3.0000 mL | INTRAVENOUS | Status: DC | PRN

## 2022-06-15 MED ORDER — SODIUM CHLORIDE 0.9 % WEIGHT BASED INFUSION: 1.0000 mL/kg/h | INTRAVENOUS | Status: DC

## 2022-06-15 MED ORDER — METHYLPREDNISOLONE SODIUM SUCC 125 MG IJ SOLR
INTRAMUSCULAR | Status: AC
  Administered 2022-06-15: 125 mg via INTRAVENOUS
  Filled 2022-06-15: qty 2

## 2022-06-15 MED ORDER — HYDRALAZINE HCL 20 MG/ML IJ SOLN: 10.0000 mg | INTRAMUSCULAR | Status: DC | PRN

## 2022-06-15 MED ORDER — HEPARIN SODIUM (PORCINE) 1000 UNIT/ML IJ SOLN
INTRAMUSCULAR | Status: DC | PRN
  Administered 2022-06-15: 6000 [IU] via INTRAVENOUS

## 2022-06-15 MED ORDER — LABETALOL HCL 5 MG/ML IV SOLN: 10.0000 mg | INTRAVENOUS | Status: DC | PRN

## 2022-06-15 MED ORDER — ACETAMINOPHEN 325 MG PO TABS: 650.0000 mg | ORAL_TABLET | ORAL | Status: DC | PRN

## 2022-06-15 MED ORDER — VERAPAMIL HCL 2.5 MG/ML IV SOLN
INTRAVENOUS | Status: DC | PRN
  Administered 2022-06-15 (×2): 10 mL via INTRA_ARTERIAL

## 2022-06-15 MED ORDER — SODIUM CHLORIDE 0.9 % IV SOLN: INTRAVENOUS | Status: DC

## 2022-06-15 MED ORDER — ASPIRIN 81 MG PO CHEW
CHEWABLE_TABLET | ORAL | Status: AC
  Administered 2022-06-15: 81 mg via ORAL
  Filled 2022-06-15: qty 1

## 2022-06-15 MED ORDER — ONDANSETRON HCL 4 MG/2ML IJ SOLN: 4.0000 mg | Freq: Four times a day (QID) | INTRAMUSCULAR | Status: DC | PRN

## 2022-06-15 MED ORDER — SODIUM CHLORIDE 0.9% FLUSH: 3.0000 mL | Freq: Two times a day (BID) | INTRAVENOUS | Status: DC

## 2022-06-15 MED ORDER — IOHEXOL 350 MG/ML SOLN
INTRAVENOUS | Status: DC | PRN
  Administered 2022-06-15: 70 mL

## 2022-06-15 MED ORDER — MIDAZOLAM HCL 2 MG/2ML IJ SOLN
INTRAMUSCULAR | Status: AC
  Filled 2022-06-15: qty 2

## 2022-06-15 MED ORDER — SODIUM CHLORIDE 0.9 % IV SOLN: 250.0000 mL | INTRAVENOUS | Status: DC | PRN

## 2022-06-15 MED ORDER — METHYLPREDNISOLONE SODIUM SUCC 125 MG IJ SOLR: 125.0000 mg | Freq: Once | INTRAMUSCULAR | Status: AC

## 2022-06-15 MED ORDER — DIPHENHYDRAMINE HCL 50 MG/ML IJ SOLN
25.0000 mg | Freq: Once | INTRAMUSCULAR | Status: AC
  Administered 2022-06-15: 25 mg via INTRAVENOUS
  Filled 2022-06-15: qty 1

## 2022-06-15 MED ORDER — FENTANYL CITRATE (PF) 100 MCG/2ML IJ SOLN
INTRAMUSCULAR | Status: DC | PRN
  Administered 2022-06-15: 25 ug via INTRAVENOUS
  Administered 2022-06-15: 50 ug via INTRAVENOUS

## 2022-06-15 MED ORDER — MIDAZOLAM HCL 2 MG/2ML IJ SOLN
INTRAMUSCULAR | Status: DC | PRN
  Administered 2022-06-15 (×2): 1 mg via INTRAVENOUS

## 2022-06-15 MED ORDER — LIDOCAINE HCL (PF) 1 % IJ SOLN
INTRAMUSCULAR | Status: AC
  Filled 2022-06-15: qty 30

## 2022-06-15 MED ORDER — HEPARIN (PORCINE) IN NACL 1000-0.9 UT/500ML-% IV SOLN
INTRAVENOUS | Status: DC | PRN
  Administered 2022-06-15 (×3): 500 mL

## 2022-06-15 MED ORDER — HEPARIN SODIUM (PORCINE) 1000 UNIT/ML IJ SOLN
INTRAMUSCULAR | Status: AC
  Filled 2022-06-15: qty 10

## 2022-06-15 MED ORDER — FENTANYL CITRATE (PF) 100 MCG/2ML IJ SOLN
INTRAMUSCULAR | Status: AC
  Filled 2022-06-15: qty 2

## 2022-06-15 MED ORDER — ASPIRIN 81 MG PO CHEW: 81.0000 mg | CHEWABLE_TABLET | ORAL | Status: AC

## 2022-06-15 MED ORDER — SODIUM CHLORIDE 0.9 % WEIGHT BASED INFUSION
3.0000 mL/kg/h | INTRAVENOUS | Status: AC
  Administered 2022-06-15: 3 mL/kg/h via INTRAVENOUS

## 2022-06-15 MED ORDER — VERAPAMIL HCL 2.5 MG/ML IV SOLN
INTRAVENOUS | Status: AC
  Filled 2022-06-15: qty 2

## 2022-06-15 SURGICAL SUPPLY — 13 items
CATH INFINITI 5 FR JL3.5 (CATHETERS) IMPLANT
CATH INFINITI 5FR ANG PIGTAIL (CATHETERS) IMPLANT
CATH LAUNCHER 6FR EBU3.5 (CATHETERS) IMPLANT
CATH OPTITORQUE TIG 4.0 5F (CATHETERS) IMPLANT
DEVICE RAD COMP TR BAND LRG (VASCULAR PRODUCTS) IMPLANT
GLIDESHEATH SLEND A-KIT 6F 22G (SHEATH) IMPLANT
GUIDEWIRE INQWIRE 1.5J.035X260 (WIRE) IMPLANT
INQWIRE 1.5J .035X260CM (WIRE) ×1
KIT ENCORE 26 ADVANTAGE (KITS) IMPLANT
KIT HEART LEFT (KITS) ×1 IMPLANT
PACK CARDIAC CATHETERIZATION (CUSTOM PROCEDURE TRAY) ×1 IMPLANT
TRANSDUCER W/STOPCOCK (MISCELLANEOUS) ×1 IMPLANT
TUBING CIL FLEX 10 FLL-RA (TUBING) ×1 IMPLANT

## 2022-06-15 NOTE — Interval H&P Note (Signed)
History and Physical Interval Note:  06/15/2022 2:55 PM  Cory Sosa  has presented today for surgery, with the diagnosis of CHEST PAIN.  The various methods of treatment have been discussed with the patient and family. After consideration of risks, benefits and other options for treatment, the patient has consented to  Procedure(s): LEFT HEART CATH AND CORONARY ANGIOGRAPHY (N/A) as a surgical intervention.  The patient's history has been reviewed, patient examined, no change in status, stable for surgery.  I have reviewed the patient's chart and labs.  Questions were answered to the patient's satisfaction.    2016 Appropriate Use Criteria for Coronary Revascularization in Patients With Acute Coronary Syndrome NSTEMI/UA Intermediate Risk (TIMI Score 3-4) NSTEMI/Unstable angina, stabilized patient at Intermediate Risk (TIMI Score 3-4) Link Here: [email or web address]-rose.net/ Indication:  Revascularization by PCI or CABG of 1 or more arteries in a patient with NSTEMI or unstable angina with Stabilization after presentation Intermediate risk for clinical events A (7) Indication: 16; Score 7   [place]

## 2022-06-15 NOTE — Progress Notes (Signed)
Pt has glucose reader on back of arm. CBG post procedure read 82 at 1630. Pt now eating and drinking.

## 2022-06-15 NOTE — Progress Notes (Signed)
TR BAND REMOVAL  LOCATION:    right radial  DEFLATED PER PROTOCOL:    Yes.    TIME BAND OFF / DRESSING APPLIED: 06/15/22 at [place] ARRIVAL:    Level 0  SITE AFTER BAND REMOVAL:    Level 0  CIRCULATION SENSATION AND MOVEMENT:    Within Normal Limits   Yes.    COMMENTS:

## 2022-06-15 NOTE — Telephone Encounter (Signed)
Chest Pain  What were you doing when it began? He says it just occurred, he doesn't recall what he was actually doing when it began. Is there anything that relieves the pain? Says cold fresh air helps some but does not relieve it completely. Doesn't notice it when sleeping, but as soon as he wakes up it is there. Can you describe the pain? (Ex. Sharp, dull) Dull throb Where does it hurt? Center of the chest  Does it radiate or move across your chest, shoulder, or down your arm? no On a scale of 1-10, how bad is the pain? 4-5 When did it start and/ or how long has it lasted? 10 days ago and has been consistent since then.  Do you have a prescription for Nitroglycerin? yes If yes, have you taken any today? no  Have you taken any erectile dysfunction medications? N/A If yes, do NOT take Nitroglycerin.  If patient has not taken any erectile dysfunction medications you can instruct patient to take as directed.

## 2022-06-15 NOTE — Telephone Encounter (Signed)
Schedule an office visit as discussed  Rex Kras, DO, [facility]

## 2022-06-15 NOTE — Discharge Summary (Signed)
Physician Discharge Summary  Patient ID: Cory Sosa MRN: [id number] DOB/AGE: 67/67/1957 67 y.o.  Admit date: 06/15/2022 Discharge date: 06/15/2022  Primary Discharge Diagnosis: Chest pain  Secondary Discharge Diagnosis: Chest pain   Hospital Course:   67 y/o Caucasian male with hypertension, hyperlipidemia, type 2 DM, CAD s/p PPCI to prox-mid LAD for STEMI (2021), now with chest pain concerning for unstable angina  Coronary angiogram showed no significant abnormality to explain chest pain, which was likely noncardiac in etiology, now resolved.    Discharge Exam: Blood pressure (!) 141/86, pulse (!) 0, temperature 98.1 F (36.7 C), temperature source Oral, resp. rate 10, height 5\' 10"  (1.778 m), weight 111.6 kg, SpO2 92 %.   Physical Exam Vitals and nursing note reviewed.  Constitutional:      General: He is not in acute distress. Neck:     Vascular: No JVD.  Cardiovascular:     Rate and Rhythm: Normal rate and regular rhythm.     Heart sounds: Normal heart sounds. No murmur heard. Pulmonary:     Effort: Pulmonary effort is normal.     Breath sounds: Normal breath sounds. No wheezing or rales.  Musculoskeletal:     Right lower leg: No edema.     Left lower leg: No edema.       Significant Diagnostic Studies:  Coronary angiography 06/15/2022: LM: Normal LAD: Patent stent. No significant CAD. LCx: No significant disease RCA: Prox 20% disease  Normal LVEF Mildly elevated LVEDP   Chest pain at rest of non-cardiac etiology.     FOLLOW UP PLANS AND APPOINTMENTS  Allergies as of 06/15/2022       Reactions   Contrast Media [iodinated Contrast Media] Nausea And Vomiting   Rocephin [ceftriaxone Sodium In Dextrose] Nausea And Vomiting        Medication List     TAKE these medications    albuterol 108 (90 Base) MCG/ACT inhaler Commonly known as: VENTOLIN HFA Inhale 1 puff into the lungs daily as needed.   aspirin 81 MG chewable tablet Chew 1 tablet (81  mg total) by mouth daily.   carvedilol 6.25 MG tablet Commonly known as: COREG Take 2 tablets (12.5 mg total) by mouth 2 (two) times daily.   ergocalciferol 1.25 MG (50000 UT) capsule Commonly known as: VITAMIN D2 Take 50,000 Units by mouth once a week. Sunday   fluticasone 50 MCG/ACT nasal spray Commonly known as: FLONASE Place 2 sprays into both nostrils daily.   FreeStyle Libre 3 Sensor Misc by Does not apply route.   INVOKAMET PO Take 1,000 mg by mouth 2 (two) times daily. 50-1000   losartan 25 MG tablet Commonly known as: COZAAR TAKE 1 TABLET EVERY EVENING   nitroGLYCERIN 0.4 MG SL tablet Commonly known as: NITROSTAT Place 1 tablet (0.4 mg total) under the tongue every 5 (five) minutes as needed for chest pain.   NovoLOG FlexPen 100 UNIT/ML FlexPen Generic drug: insulin aspart Inject 24 Units into the skin 3 (three) times daily with meals. And 10 units with a snack   nystatin ointment Commonly known as: MYCOSTATIN Apply 1 application topically 2 (two) times daily as needed. Balanitis   pantoprazole 40 MG tablet Commonly known as: PROTONIX Take 1 tablet by mouth daily.   rosuvastatin 20 MG tablet Commonly known as: CRESTOR Take 20 mg by mouth at bedtime.   SUPER B COMPLEX/C PO Take 1 tablet by mouth daily.   Toujeo Max SoloStar 300 UNIT/ML Solostar Pen Generic drug: insulin glargine (2 Unit  Dial) Inject 60 Units into the skin daily.   zolpidem 10 MG tablet Commonly known as: AMBIEN Take 5 mg by mouth at bedtime as needed for sleep.           Nigel Mormon, MD Pager: [phone] Office: [phone]

## 2022-06-15 NOTE — Progress Notes (Signed)
Primary Physician/Referring:  Chesley Noon, MD  Patient ID: Cory Sosa, male    DOB: December 21, 1955, 67 y.o.   MRN: [id number]  Chief Complaint  Patient presents with   Coronary artery disease involving native coronary artery of   Follow-up   HPI:    Cory Sosa  is a 67 y.o. male patient with hypertension, hyperlipidemia, diabetes mellitus, HX of STEMI s/p PCI to proximal to mid LAD, obesity.   Patient have a history of anterior STEMI requiring PCI to the proximal to mid LAD back in November 2021.  He presents today for 1 year follow-up visit.  On previous office visit he has had substernal chest pain at times.  He states that he has discomfort couple times a month, at times brought on by effort related activities, improves with resting and/or exposure to cool air.  The symptoms are not equivalent to his anginal discomfort back in 2021.  No use of sublingual nitroglycerin tablet.  He continues to walk 2 to 3 miles 3 times a week.  Today patient called office this morning complaining of chest pain. He states it was a sudden onset and does not believe it was brought on by anything, as he ws just sitting at his desk at work. He states the cold air makes the pain better, but does not take the pain away. He states he noticed it as soon as he wakes up. He describes the pain to be a dull throbbing, pressure, pain, in the center of his chest with no radiating pain. Rates the pain to be a 4-5/10. Started approx 10 days ago and has been consistent since, and did not take any nitroglycerin today. He does state that this pain comes and goes however, it noticing more whenever he moves around. He states he has been working in the garden and doing work around [employer]. He did not notice the pain when working, but once he sits down he feels the chest pain. He states this pain is not similar to when he had his previous heart attack.  Past Medical History:  Diagnosis Date   Coronary artery disease     Diabetes mellitus without complication ([place])    Hyperlipidemia    Hypertension    controlled with medications   Past Surgical History:  Procedure Laterality Date   CARDIAC CATHETERIZATION     CORONARY THROMBECTOMY N/A 02/07/2020   Procedure: Coronary Thrombectomy;  Surgeon: Adrian Prows, MD;  Location: [facility];  Service: Cardiovascular;  Laterality: N/A;   CORONARY/GRAFT ACUTE MI REVASCULARIZATION N/A 02/07/2020   Procedure: Coronary/Graft Acute MI Revascularization;  Surgeon: Adrian Prows, MD;  Location: [facility];  Service: Cardiovascular;  Laterality: N/A;   LEFT HEART CATH AND CORONARY ANGIOGRAPHY N/A 02/07/2020   Procedure: LEFT HEART CATH AND CORONARY ANGIOGRAPHY;  Surgeon: Adrian Prows, MD;  Location: [facility];  Service: Cardiovascular;  Laterality: N/A;   Family History  Problem Relation Age of Onset   Pneumonia Mother     Social History   Tobacco Use   Smoking status: Never   Smokeless tobacco: Never  Substance Use Topics   Alcohol use: Never   Marital Status: Married   ROS  Review of Systems  Constitutional: Negative.   Respiratory:  Negative for sputum production.   Cardiovascular:  Positive for chest pain. Negative for palpitations, orthopnea, claudication, leg swelling and PND.  Musculoskeletal: Negative.   Neurological: Negative.   Psychiatric/Behavioral: Negative.     Objective  Blood pressure 126/83, pulse 69, height 5\' 9"  (1.753 m), weight 246 lb (111.6 kg), SpO2 96 %.     06/15/2022   10:08 AM 06/08/2022   10:28 AM 05/31/2021    8:48 AM  Vitals with BMI  Height 5\' 9"  5\' 9"  5\' 9"   Weight 246 lbs 245 lbs 10 oz 246 lbs 3 oz  BMI 36.31 [id number]  Systolic [id number]  Diastolic 83 76 69  Pulse 69 68 69    Physical Exam Cardiovascular:     Rate and Rhythm: Normal rate and regular rhythm.     Pulses: Normal pulses.     Heart sounds: No murmur heard.    No friction rub.  Pulmonary:     Effort: Pulmonary effort is normal.      Breath sounds: Normal breath sounds.  Abdominal:     General: Bowel sounds are normal.     Palpations: Abdomen is soft.  Musculoskeletal:     Right lower leg: No edema.     Left lower leg: No edema.  Skin:    General: Skin is warm and dry.  Neurological:     Mental Status: He is alert. Mental status is at baseline.  Psychiatric:        Mood and Affect: Mood normal.     Laboratory examination:  External labs:  Component 04/26/22 02/22/21 11/17/20 04/28/20 11/17/19 04/22/19  Cholesterol, Total 121 105 104 99 Low  123 101  Triglycerides 95 308 High  242 High  130 208 High  165 High   HDL 48 39 Low  43 42 -- 40  VLDL Cholesterol Cal 18 44 High  37 23 -- 27  LDL 55 22 24 34 -- 34   Component 04/26/22 11/29/21 05/24/21 11/17/20 04/28/20 03/22/20  Glucose 78 143 High  164 High  187 High  166 High  --  BUN 15 16 14 17 12  --  Creatinine 0.93 0.93 0.82 1.03 0.95 --  eGFR 90 91 97 81 -- --  BUN/Creatinine Ratio 16 17 17 17 13  --  Sodium 142 141 143 141 140 --  Potassium 4.1 4.7 4.5 4.3 4.7 --  Chloride 102 104 105 103 103 --  CO2 22 21 24 20 23  --  CALCIUM 10.2 10.1 9.9 10.1 9.9 --  Total Protein 7.1 7.1 6.9 7.0 7.0 7.3  Albumin, Serum 4.4 4.5 4.5 4.7 4.4 4.6  Globulin, Total 2.7 2.6 2.4 2.3 2.6 --  Albumin/Globulin Ratio 1.6 1.7 1.9 2.0 1.7 --  Total Bilirubin 0.5 0.4 0.2 0.3 0.3 0.4  Alkaline Phosphatase 69 67 68 72 66 71   AST 35 50 High  29 28 32 50 High   ALT (SGPT) 54 High  77 High  55 High  45 High  46 High  79 High     Allergies   Allergies  Allergen Reactions   Contrast Media [Iodinated Contrast Media] Nausea And Vomiting   Rocephin [Ceftriaxone Sodium In Dextrose] Nausea And Vomiting    Medications Prior to Visit:   Outpatient Medications Prior to Visit  Medication Sig Dispense Refill   albuterol (VENTOLIN HFA) 108 (90 Base) MCG/ACT inhaler Inhale 1 puff into the lungs daily as needed.     aspirin 81 MG chewable tablet Chew 1 tablet (81 mg total) by mouth  daily.     Canagliflozin-metFORMIN HCl (INVOKAMET PO) Take 1,000 mg by mouth 2 (two) times daily. 50-1000     carvedilol (COREG) 6.25 MG tablet Take 2 tablets (  12.5 mg total) by mouth 2 (two) times daily. 180 tablet 3   Continuous Blood Gluc Sensor (FREESTYLE LIBRE 3 SENSOR) MISC by Does not apply route.     ergocalciferol (VITAMIN D2) 1.25 MG (50000 UT) capsule Take 50,000 Units by mouth once a week. Sunday     fluticasone (FLONASE) 50 MCG/ACT nasal spray Place 2 sprays into both nostrils daily.     insulin aspart (NOVOLOG FLEXPEN) 100 UNIT/ML FlexPen Inject 24 Units into the skin 3 (three) times daily with meals. And 10 units with a snack     insulin glargine, 2 Unit Dial, (TOUJEO MAX SOLOSTAR) 300 UNIT/ML Solostar Pen Inject 60 Units into the skin daily.     losartan (COZAAR) 25 MG tablet TAKE 1 TABLET EVERY EVENING 90 tablet 3   nitroGLYCERIN (NITROSTAT) 0.4 MG SL tablet Place 1 tablet (0.4 mg total) under the tongue every 5 (five) minutes as needed for chest pain. 25 tablet 1   nystatin ointment (MYCOSTATIN) Apply 1 application topically 2 (two) times daily as needed. Balanitis     pantoprazole (PROTONIX) 40 MG tablet Take 1 tablet by mouth daily.     rosuvastatin (CRESTOR) 20 MG tablet Take 20 mg by mouth at bedtime.     SUPER B COMPLEX/C PO Take 1 tablet by mouth daily.     zolpidem (AMBIEN) 10 MG tablet Take 5 mg by mouth at bedtime as needed for sleep.   2   No facility-administered medications prior to visit.   Final Medications at End of Visit    Current Meds  Medication Sig   albuterol (VENTOLIN HFA) 108 (90 Base) MCG/ACT inhaler Inhale 1 puff into the lungs daily as needed.   aspirin 81 MG chewable tablet Chew 1 tablet (81 mg total) by mouth daily.   Canagliflozin-metFORMIN HCl (INVOKAMET PO) Take 1,000 mg by mouth 2 (two) times daily. 50-1000   carvedilol (COREG) 6.25 MG tablet Take 2 tablets (12.5 mg total) by mouth 2 (two) times daily.   Continuous Blood Gluc Sensor  (FREESTYLE LIBRE 3 SENSOR) MISC by Does not apply route.   ergocalciferol (VITAMIN D2) 1.25 MG (50000 UT) capsule Take 50,000 Units by mouth once a week. Sunday   fluticasone (FLONASE) 50 MCG/ACT nasal spray Place 2 sprays into both nostrils daily.   insulin aspart (NOVOLOG FLEXPEN) 100 UNIT/ML FlexPen Inject 24 Units into the skin 3 (three) times daily with meals. And 10 units with a snack   insulin glargine, 2 Unit Dial, (TOUJEO MAX SOLOSTAR) 300 UNIT/ML Solostar Pen Inject 60 Units into the skin daily.   losartan (COZAAR) 25 MG tablet TAKE 1 TABLET EVERY EVENING   nitroGLYCERIN (NITROSTAT) 0.4 MG SL tablet Place 1 tablet (0.4 mg total) under the tongue every 5 (five) minutes as needed for chest pain.   nystatin ointment (MYCOSTATIN) Apply 1 application topically 2 (two) times daily as needed. Balanitis   pantoprazole (PROTONIX) 40 MG tablet Take 1 tablet by mouth daily.   rosuvastatin (CRESTOR) 20 MG tablet Take 20 mg by mouth at bedtime.   SUPER B COMPLEX/C PO Take 1 tablet by mouth daily.   zolpidem (AMBIEN) 10 MG tablet Take 5 mg by mouth at bedtime as needed for sleep.    Radiology:   No results found. DG Chest Portable 1 View 02/07/2020 CLINICAL DATA:  Chest pain, dizziness, nausea EXAM: PORTABLE CHEST 1 VIEW COMPARISON:  03/03/2018 FINDINGS: Single frontal view of the chest demonstrates an unremarkable cardiac silhouette. No airspace disease, effusion, or pneumothorax.  No acute bony abnormality.   Cardiac Studies:   Carotid artery duplex 12/08/2020:  No hemodynamically significant arterial disease in the internal carotid artery bilaterally.  Very mild soft plaque noted in the bilateral CCA.  Antegrade right vertebral artery flow. Antegrade left vertebral artery flow.  Ambulatory cardiac telemetry 14 days (11/04/2020 - 11/18/2020): Predominant underlying rhythm was sinus.  Minimum heart rate 46 bpm, maximum heart rate 124 bpm, average heart rate 60 bpm.  Single episode of  supraventricular tachycardia lasting 4 beats.  Frequent PACs (7.7% burden) and occasional PVCs (1.5% burden).  Single episode of PACs and PVCs were symptomatic, otherwise asymptomatic.  Ventricular bigeminy and trigeminy were present.  No evidence of ventricular tachycardia, atrial fibrillation, high degree AV block, or pauses >3 seconds.  PCV ECHOCARDIOGRAM COMPLETE 0000000 Normal LV systolic function with visual EF 55-60%. Left ventricle cavity is normal in size. Normal global wall motion. Normal diastolic filling pattern, normal LAP. No significant valvular heart disease. Compared to prior study dated 02/08/2020: LVEF has improved from 40-45% to 55-60% with no regional wall motion abnormalities. Otherwise, no significant changes noted.   Coronary angiography and angioplasty 02/07/2020: RCA: Dominant, mild disease throughout. Left main: Smooth and normal. Circumflex: Moderate sized, mild diffuse disease. Ramus intermediate: Mild disease.  Small. LAD: Large vessel.  Occluded after the origin of a large D1.  Successful thrombectomy followed by overlapping 4.0 x 38 resolute Onyx and 3.5 x 12 mm Synergy DES deployed at 12 atmospheric pressure each, distal stent was postdilated with the same stent balloon at 16 atmospheric pressure at the overlap.  TIMI 0 to TIMI III flow improved, no evidence of edge dissection.  100 mL contrast utilized.   Recommendation: Patient will be observed in the intensive care unit.  He will be started on dual antiplatelet therapy and aggressive risk factor modification will be continued  EKG:  06/08/2022: Sinus rhythm, 62 bpm, without underlying ischemia or injury pattern.  Assessment     ICD-10-CM   1. Coronary artery disease involving native coronary artery of native heart with other form of angina pectoris ([place])  I25.118 EKG 12-Lead    2. Unstable angina ([facility])  I20.0        There are no discontinued medications.   No orders of the defined types were placed  in this encounter.  Recommendations:   Cory Sosa is a 67 y.o. male patient with hypertension, hyperlipidemia, diabetes mellitus, HX of STEMI s/p PCI to proximal to mid LAD, obesity.   Coronary artery disease involving native coronary artery of native heart with other form of angina pectoris Precordial pain Hx of ST elevation myocardial infarction Unstable angina Precordial discomfort suggestive of possible cardiac etiology based on symptoms, however, there may be a musculoskeletal component to this Multiple cardiovascular risk factors as outlined above. Most recent LDL at goal 55 mg/dL. EKG today is nonischemic..  Instructed to take 1 sublingual nitroglycerin in office today with relief Patient has schedule test (ECHO and Stress Test) pending Given his symptoms, we will get him scheduled for outpatient heart cath today with Dr Einar Gip. His wife is on the way,  Essential hypertension Office blood pressures are well-controlled. Medications reconciled.   No changes warranted at this time  Type 2 diabetes mellitus with hyperglycemia, with long-term current use of insulin ([place]) Reemphasized importance of glycemic control. Currently on ARB, statin therapy, Invokana.   Patient seen with Erma Heritage, NP-s. I independently reviewed the chart and examined the patient. I agree with assessment & plan  as documented above.     Floydene Flock, DO Pager: [phone] Office: [phone]

## 2022-06-15 NOTE — H&P (Signed)
OV 06/15/2022 copied for documentation    Primary Physician/Referring:  Chesley Noon, MD  Patient ID: Cory Sosa, male    DOB: 1955/08/10, 67 y.o.   MRN: [id number]  Chief Complaint  Patient presents with   Coronary artery disease involving native coronary artery of   Follow-up   HPI:    Cory Sosa  is a 67 y.o. male patient with hypertension, hyperlipidemia, diabetes mellitus, HX of STEMI s/p PCI to proximal to mid LAD, obesity.   Patient have a history of anterior STEMI requiring PCI to the proximal to mid LAD back in November 2021.  He presents today for 1 year follow-up visit.  On previous office visit he has had substernal chest pain at times.  He states that he has discomfort couple times a month, at times brought on by effort related activities, improves with resting and/or exposure to cool air.  The symptoms are not equivalent to his anginal discomfort back in 2021.  No use of sublingual nitroglycerin tablet.  He continues to walk 2 to 3 miles 3 times a week.  Today patient called office this morning complaining of chest pain. He states it was a sudden onset and does not believe it was brought on by anything, as he ws just sitting at his desk at work. He states the cold air makes the pain better, but does not take the pain away. He states he noticed it as soon as he wakes up. He describes the pain to be a dull throbbing, pressure, pain, in the center of his chest with no radiating pain. Rates the pain to be a 4-5/10. Started approx 10 days ago and has been consistent since, and did not take any nitroglycerin today. He does state that this pain comes and goes however, it noticing more whenever he moves around. He states he has been working in the garden and doing work around [employer]. He did not notice the pain when working, but once he sits down he feels the chest pain. He states this pain is not similar to when he had his previous heart attack.  Past Medical History:  Diagnosis  Date   Coronary artery disease    Diabetes mellitus without complication ([place])    Hyperlipidemia    Hypertension    controlled with medications   Past Surgical History:  Procedure Laterality Date   CARDIAC CATHETERIZATION     CORONARY THROMBECTOMY N/A 02/07/2020   Procedure: Coronary Thrombectomy;  Surgeon: Adrian Prows, MD;  Location: [place] [facility];  Service: Cardiovascular;  Laterality: N/A;   CORONARY/GRAFT ACUTE MI REVASCULARIZATION N/A 02/07/2020   Procedure: Coronary/Graft Acute MI Revascularization;  Surgeon: Adrian Prows, MD;  Location: [place];  Service: Cardiovascular;  Laterality: N/A;   LEFT HEART CATH AND CORONARY ANGIOGRAPHY N/A 02/07/2020   Procedure: LEFT HEART CATH AND CORONARY ANGIOGRAPHY;  Surgeon: Adrian Prows, MD;  Location: [place] [facility];  Service: Cardiovascular;  Laterality: N/A;   Family History  Problem Relation Age of Onset   Pneumonia Mother     Social History   Tobacco Use   Smoking status: Never   Smokeless tobacco: Never  Substance Use Topics   Alcohol use: Never   Marital Status: Married   ROS  Review of Systems  Constitutional: Negative.   Respiratory:  Negative for sputum production.   Cardiovascular:  Positive for chest pain. Negative for palpitations, orthopnea, claudication, leg swelling and PND.  Musculoskeletal: Negative.   Neurological: Negative.   Psychiatric/Behavioral:  Negative.     Objective  Blood pressure 126/83, pulse 69, height 5\' 9"  (1.753 m), weight 246 lb (111.6 kg), SpO2 96 %.     06/15/2022   10:08 AM 06/08/2022   10:28 AM 05/31/2021    8:48 AM  Vitals with BMI  Height 5\' 9"  5\' 9"  5\' 9"   Weight 246 lbs 245 lbs 10 oz 246 lbs 3 oz  BMI 36.31 [id number]  Systolic [id number]  Diastolic 83 76 69  Pulse 69 68 69    Physical Exam Cardiovascular:     Rate and Rhythm: Normal rate and regular rhythm.     Pulses: Normal pulses.     Heart sounds: No murmur heard.    No friction rub.  Pulmonary:      Effort: Pulmonary effort is normal.     Breath sounds: Normal breath sounds.  Abdominal:     General: Bowel sounds are normal.     Palpations: Abdomen is soft.  Musculoskeletal:     Right lower leg: No edema.     Left lower leg: No edema.  Skin:    General: Skin is warm and dry.  Neurological:     Mental Status: He is alert. Mental status is at baseline.  Psychiatric:        Mood and Affect: Mood normal.     Laboratory examination:  External labs:  Component 04/26/22 02/22/21 11/17/20 04/28/20 11/17/19 04/22/19  Cholesterol, Total 121 105 104 99 Low  123 101  Triglycerides 95 308 High  242 High  130 208 High  165 High   HDL 48 39 Low  43 42 -- 40  VLDL Cholesterol Cal 18 44 High  37 23 -- 27  LDL 55 22 24 34 -- 34   Component 04/26/22 11/29/21 05/24/21 11/17/20 04/28/20 03/22/20  Glucose 78 143 High  164 High  187 High  166 High  --  BUN 15 16 14 17 12  --  Creatinine 0.93 0.93 0.82 1.03 0.95 --  eGFR 90 91 97 81 -- --  BUN/Creatinine Ratio 16 17 17 17 13  --  Sodium 142 141 143 141 140 --  Potassium 4.1 4.7 4.5 4.3 4.7 --  Chloride 102 104 105 103 103 --  CO2 22 21 24 20 23  --  CALCIUM 10.2 10.1 9.9 10.1 9.9 --  Total Protein 7.1 7.1 6.9 7.0 7.0 7.3  Albumin, Serum 4.4 4.5 4.5 4.7 4.4 4.6  Globulin, Total 2.7 2.6 2.4 2.3 2.6 --  Albumin/Globulin Ratio 1.6 1.7 1.9 2.0 1.7 --  Total Bilirubin 0.5 0.4 0.2 0.3 0.3 0.4  Alkaline Phosphatase 69 67 68 72 66 71   AST 35 50 High  29 28 32 50 High   ALT (SGPT) 54 High  77 High  55 High  45 High  46 High  79 High     Allergies   Allergies  Allergen Reactions   Contrast Media [Iodinated Contrast Media] Nausea And Vomiting   Rocephin [Ceftriaxone Sodium In Dextrose] Nausea And Vomiting    Medications Prior to Visit:   Outpatient Medications Prior to Visit  Medication Sig Dispense Refill   albuterol (VENTOLIN HFA) 108 (90 Base) MCG/ACT inhaler Inhale 1 puff into the lungs daily as needed.     aspirin 81 MG chewable tablet  Chew 1 tablet (81 mg total) by mouth daily.     Canagliflozin-metFORMIN HCl (INVOKAMET PO) Take 1,000 mg by mouth 2 (two) times daily. 50-1000     carvedilol (  COREG) 6.25 MG tablet Take 2 tablets (12.5 mg total) by mouth 2 (two) times daily. 180 tablet 3   Continuous Blood Gluc Sensor (FREESTYLE LIBRE 3 SENSOR) MISC by Does not apply route.     ergocalciferol (VITAMIN D2) 1.25 MG (50000 UT) capsule Take 50,000 Units by mouth once a week. Sunday     fluticasone (FLONASE) 50 MCG/ACT nasal spray Place 2 sprays into both nostrils daily.     insulin aspart (NOVOLOG FLEXPEN) 100 UNIT/ML FlexPen Inject 24 Units into the skin 3 (three) times daily with meals. And 10 units with a snack     insulin glargine, 2 Unit Dial, (TOUJEO MAX SOLOSTAR) 300 UNIT/ML Solostar Pen Inject 60 Units into the skin daily.     losartan (COZAAR) 25 MG tablet TAKE 1 TABLET EVERY EVENING 90 tablet 3   nitroGLYCERIN (NITROSTAT) 0.4 MG SL tablet Place 1 tablet (0.4 mg total) under the tongue every 5 (five) minutes as needed for chest pain. 25 tablet 1   nystatin ointment (MYCOSTATIN) Apply 1 application topically 2 (two) times daily as needed. Balanitis     pantoprazole (PROTONIX) 40 MG tablet Take 1 tablet by mouth daily.     rosuvastatin (CRESTOR) 20 MG tablet Take 20 mg by mouth at bedtime.     SUPER B COMPLEX/C PO Take 1 tablet by mouth daily.     zolpidem (AMBIEN) 10 MG tablet Take 5 mg by mouth at bedtime as needed for sleep.   2   No facility-administered medications prior to visit.   Final Medications at End of Visit    Current Meds  Medication Sig   albuterol (VENTOLIN HFA) 108 (90 Base) MCG/ACT inhaler Inhale 1 puff into the lungs daily as needed.   aspirin 81 MG chewable tablet Chew 1 tablet (81 mg total) by mouth daily.   Canagliflozin-metFORMIN HCl (INVOKAMET PO) Take 1,000 mg by mouth 2 (two) times daily. 50-1000   carvedilol (COREG) 6.25 MG tablet Take 2 tablets (12.5 mg total) by mouth 2 (two) times daily.    Continuous Blood Gluc Sensor (FREESTYLE LIBRE 3 SENSOR) MISC by Does not apply route.   ergocalciferol (VITAMIN D2) 1.25 MG (50000 UT) capsule Take 50,000 Units by mouth once a week. Sunday   fluticasone (FLONASE) 50 MCG/ACT nasal spray Place 2 sprays into both nostrils daily.   insulin aspart (NOVOLOG FLEXPEN) 100 UNIT/ML FlexPen Inject 24 Units into the skin 3 (three) times daily with meals. And 10 units with a snack   insulin glargine, 2 Unit Dial, (TOUJEO MAX SOLOSTAR) 300 UNIT/ML Solostar Pen Inject 60 Units into the skin daily.   losartan (COZAAR) 25 MG tablet TAKE 1 TABLET EVERY EVENING   nitroGLYCERIN (NITROSTAT) 0.4 MG SL tablet Place 1 tablet (0.4 mg total) under the tongue every 5 (five) minutes as needed for chest pain.   nystatin ointment (MYCOSTATIN) Apply 1 application topically 2 (two) times daily as needed. Balanitis   pantoprazole (PROTONIX) 40 MG tablet Take 1 tablet by mouth daily.   rosuvastatin (CRESTOR) 20 MG tablet Take 20 mg by mouth at bedtime.   SUPER B COMPLEX/C PO Take 1 tablet by mouth daily.   zolpidem (AMBIEN) 10 MG tablet Take 5 mg by mouth at bedtime as needed for sleep.    Radiology:   No results found. DG Chest Portable 1 View 02/07/2020 CLINICAL DATA:  Chest pain, dizziness, nausea EXAM: PORTABLE CHEST 1 VIEW COMPARISON:  03/03/2018 FINDINGS: Single frontal view of the chest demonstrates an unremarkable cardiac  silhouette. No airspace disease, effusion, or pneumothorax. No acute bony abnormality.   Cardiac Studies:   Carotid artery duplex 12/20/2020:  No hemodynamically significant arterial disease in the internal carotid artery bilaterally.  Very mild soft plaque noted in the bilateral CCA.  Antegrade right vertebral artery flow. Antegrade left vertebral artery flow.  Ambulatory cardiac telemetry 14 days (11/04/2020 - 11/18/2020): Predominant underlying rhythm was sinus.  Minimum heart rate 46 bpm, maximum heart rate 124 bpm, average heart rate 60 bpm.   Single episode of supraventricular tachycardia lasting 4 beats.  Frequent PACs (7.7% burden) and occasional PVCs (1.5% burden).  Single episode of PACs and PVCs were symptomatic, otherwise asymptomatic.  Ventricular bigeminy and trigeminy were present.  No evidence of ventricular tachycardia, atrial fibrillation, high degree AV block, or pauses >3 seconds.  PCV ECHOCARDIOGRAM COMPLETE 0000000 Normal LV systolic function with visual EF 55-60%. Left ventricle cavity is normal in size. Normal global wall motion. Normal diastolic filling pattern, normal LAP. No significant valvular heart disease. Compared to prior study dated 02/08/2020: LVEF has improved from 40-45% to 55-60% with no regional wall motion abnormalities. Otherwise, no significant changes noted.   Coronary angiography and angioplasty 02/07/2020: RCA: Dominant, mild disease throughout. Left main: Smooth and normal. Circumflex: Moderate sized, mild diffuse disease. Ramus intermediate: Mild disease.  Small. LAD: Large vessel.  Occluded after the origin of a large D1.  Successful thrombectomy followed by overlapping 4.0 x 38 resolute Onyx and 3.5 x 12 mm Synergy DES deployed at 12 atmospheric pressure each, distal stent was postdilated with the same stent balloon at 16 atmospheric pressure at the overlap.  TIMI 0 to TIMI III flow improved, no evidence of edge dissection.  100 mL contrast utilized.   Recommendation: Patient will be observed in the intensive care unit.  He will be started on dual antiplatelet therapy and aggressive risk factor modification will be continued  EKG:  06/08/2022: Sinus rhythm, 62 bpm, without underlying ischemia or injury pattern.  Assessment     ICD-10-CM   1. Coronary artery disease involving native coronary artery of native heart with other form of angina pectoris ([place])  I25.118 EKG 12-Lead    2. Unstable angina ([facility])  I20.0        There are no discontinued medications.   No orders of the defined  types were placed in this encounter.  Recommendations:   Cory Sosa is a 67 y.o. male patient with hypertension, hyperlipidemia, diabetes mellitus, HX of STEMI s/p PCI to proximal to mid LAD, obesity.   Coronary artery disease involving native coronary artery of native heart with other form of angina pectoris Precordial pain Hx of ST elevation myocardial infarction Unstable angina Precordial discomfort suggestive of possible cardiac etiology based on symptoms, however, there may be a musculoskeletal component to this Multiple cardiovascular risk factors as outlined above. Most recent LDL at goal 55 mg/dL. EKG today is nonischemic..  Instructed to take 1 sublingual nitroglycerin in office today with relief Patient has schedule test (ECHO and Stress Test) pending Given his symptoms, we will get him scheduled for outpatient heart cath today with Dr Einar Gip. His wife is on the way,  Essential hypertension Office blood pressures are well-controlled. Medications reconciled.   No changes warranted at this time  Type 2 diabetes mellitus with hyperglycemia, with long-term current use of insulin ([place]) Reemphasized importance of glycemic control. Currently on ARB, statin therapy, Invokana.   Patient seen with Erma Heritage, NP-s. I independently reviewed the chart and examined the  patient. I agree with assessment & plan as documented above.     Floydene Flock, DO Pager: [phone] Office: [phone]

## 2022-06-15 NOTE — Progress Notes (Signed)
Report given to Anisha,RN who will assume care  at this time 

## 2022-06-16 ENCOUNTER — Encounter: Payer: Self-pay | Admitting: Cardiology

## 2022-06-18 ENCOUNTER — Encounter (HOSPITAL_COMMUNITY): Payer: Self-pay | Admitting: Cardiology

## 2022-06-18 NOTE — Telephone Encounter (Signed)
Patient is scheduled echo and stress test does he still need them?

## 2022-06-19 ENCOUNTER — Ambulatory Visit: Payer: Medicare Other

## 2022-06-19 DIAGNOSIS — R072 Precordial pain: Secondary | ICD-10-CM

## 2022-06-19 DIAGNOSIS — I25118 Atherosclerotic heart disease of native coronary artery with other forms of angina pectoris: Secondary | ICD-10-CM

## 2022-06-19 DIAGNOSIS — I252 Old myocardial infarction: Secondary | ICD-10-CM

## 2022-06-19 MED FILL — Lidocaine HCl Local Preservative Free (PF) Inj 1%: INTRAMUSCULAR | Qty: 30 | Status: AC

## 2022-07-02 ENCOUNTER — Other Ambulatory Visit: Payer: Medicare Other

## 2022-07-02 ENCOUNTER — Ambulatory Visit: Payer: Medicare Other | Admitting: Cardiology

## 2022-07-02 ENCOUNTER — Encounter: Payer: Self-pay | Admitting: Cardiology

## 2022-07-02 VITALS — BP 126/81 | HR 80 | Resp 18 | Ht 70.0 in | Wt 246.6 lb

## 2022-07-02 DIAGNOSIS — Z955 Presence of coronary angioplasty implant and graft: Secondary | ICD-10-CM

## 2022-07-02 DIAGNOSIS — I10 Essential (primary) hypertension: Secondary | ICD-10-CM

## 2022-07-02 DIAGNOSIS — I252 Old myocardial infarction: Secondary | ICD-10-CM

## 2022-07-02 DIAGNOSIS — I2510 Atherosclerotic heart disease of native coronary artery without angina pectoris: Secondary | ICD-10-CM

## 2022-07-02 DIAGNOSIS — E66812 Obesity, class 2: Secondary | ICD-10-CM

## 2022-07-02 DIAGNOSIS — E1169 Type 2 diabetes mellitus with other specified complication: Secondary | ICD-10-CM

## 2022-07-02 DIAGNOSIS — E1165 Type 2 diabetes mellitus with hyperglycemia: Secondary | ICD-10-CM

## 2022-07-02 NOTE — Progress Notes (Signed)
Primary Physician/Referring:  [name], MD  Patient ID: Cory Sosa, male    DOB: 1955/05/22, 67 y.o.   MRN: [id number]  Chief Complaint  Patient presents with   Hospitalization Follow-up   Follow-up    1 month   HPI:    Cory Sosa  is a 67 y.o. male patient with hypertension, hyperlipidemia, diabetes mellitus, HX of STEMI s/p PCI to proximal to mid LAD, obesity.   History of anterior wall STEMI in November 2021 requiring PCI to the proximal/mid LAD.  Since last office visit patient had worsening/progressive chest pain for which she came into the office for sick visit.  He underwent angiography for unstable angina and was noted to have no significant epicardial coronary disease and prior stents were patent.  Since then he had an echocardiogram at the office results reviewed with him in detail and noted below for further reference.  Clinically he denies anginal chest pain or heart failure symptoms.  No use of sublingual nitroglycerin tablets after his angiography in March 2024.  Also walks 1 to 2 miles per day at work and tries to swim at [employer].  Past Medical History:  Diagnosis Date   Coronary artery disease    Diabetes mellitus without complication    Hyperlipidemia    Hypertension    controlled with medications   Past Surgical History:  Procedure Laterality Date   CARDIAC CATHETERIZATION     CORONARY THROMBECTOMY N/A 02/07/2020   Procedure: Coronary Thrombectomy;  Surgeon: Yates Decamp, MD;  Location: [facility] INVASIVE CV LAB;  Service: Cardiovascular;  Laterality: N/A;   CORONARY/GRAFT ACUTE MI REVASCULARIZATION N/A 02/07/2020   Procedure: Coronary/Graft Acute MI Revascularization;  Surgeon: Yates Decamp, MD;  Location: [facility] INVASIVE CV LAB;  Service: Cardiovascular;  Laterality: N/A;   LEFT HEART CATH AND CORONARY ANGIOGRAPHY N/A 02/07/2020   Procedure: LEFT HEART CATH AND CORONARY ANGIOGRAPHY;  Surgeon: Yates Decamp, MD;  Location: [facility] INVASIVE CV LAB;  Service:  Cardiovascular;  Laterality: N/A;   LEFT HEART CATH AND CORONARY ANGIOGRAPHY N/A 06/15/2022   Procedure: LEFT HEART CATH AND CORONARY ANGIOGRAPHY;  Surgeon: Elder Negus, MD;  Location: [facility] INVASIVE CV LAB;  Service: Cardiovascular;  Laterality: N/A;   Family History  Problem Relation Age of Onset   Pneumonia Mother     Social History   Tobacco Use   Smoking status: Never   Smokeless tobacco: Never  Substance Use Topics   Alcohol use: Never   Marital Status: Married   ROS  Review of Systems  Cardiovascular:  Negative for chest pain, claudication, dyspnea on exertion, irregular heartbeat, leg swelling, near-syncope, orthopnea, palpitations, paroxysmal nocturnal dyspnea and syncope.  Respiratory:  Negative for shortness of breath.   Hematologic/Lymphatic: Negative for bleeding problem.  Musculoskeletal:  Negative for muscle cramps and myalgias.  Neurological:  Negative for dizziness and light-headedness.    Objective  Blood pressure 126/81, pulse 80, resp. rate 18, height 5\' 10"  (1.778 m), weight 246 lb 9.6 oz (111.9 kg), SpO2 96 %.     07/02/2022   10:34 AM 06/15/2022    6:02 PM 06/15/2022    5:35 PM  Vitals with BMI  Height 5\' 10"     Weight 246 lbs 10 oz    BMI 35.38    Systolic 126 125 643  Diastolic 81 69 83  Pulse 80 65 69    Physical Exam  Constitutional: No distress.  Age appropriate, hemodynamically stable.   Neck: No JVD present.  Cardiovascular: Normal rate,  regular rhythm, S1 normal, S2 normal, intact distal pulses and normal pulses. Exam reveals no gallop, no S3 and no S4.  No murmur heard. Pulses:      Dorsalis pedis pulses are 2+ on the right side and 2+ on the left side.       Posterior tibial pulses are 2+ on the right side and 2+ on the left side.  Right radial site is healing well, no hematoma or bruit.  Pulmonary/Chest: Effort normal and breath sounds normal. No stridor. He has no wheezes. He has no rales.  Abdominal: Soft. Bowel sounds are  normal. He exhibits no distension. There is no abdominal tenderness.  obese  Musculoskeletal:        General: No edema.     Cervical back: Neck supple.  Neurological: He is alert and oriented to person, place, and time. He has intact cranial nerves (2-12).  Skin: Skin is warm and moist.     Laboratory examination:  External labs:  Component 04/26/22 02/22/21 11/17/20 04/28/20 11/17/19 04/22/19  Cholesterol, Total 121 105 104 99 Low  123 101  Triglycerides 95 308 High  242 High  130 208 High  165 High   HDL 48 39 Low  43 42 -- 40  VLDL Cholesterol Cal 18 44 High  37 23 -- 27  LDL 55 22 24 34 -- 34   Component 04/26/22 11/29/21 05/24/21 11/17/20 04/28/20 03/22/20  Glucose 78 143 High  164 High  187 High  166 High  --  BUN 15 16 14 17 12  --  Creatinine 0.93 0.93 0.82 1.03 0.95 --  eGFR 90 91 97 81 -- --  BUN/Creatinine Ratio 16 17 17 17 13  --  Sodium 142 141 143 141 140 --  Potassium 4.1 4.7 4.5 4.3 4.7 --  Chloride 102 104 105 103 103 --  CO2 22 21 24 20 23  --  CALCIUM 10.2 10.1 9.9 10.1 9.9 --  Total Protein 7.1 7.1 6.9 7.0 7.0 7.3  Albumin, Serum 4.4 4.5 4.5 4.7 4.4 4.6  Globulin, Total 2.7 2.6 2.4 2.3 2.6 --  Albumin/Globulin Ratio 1.6 1.7 1.9 2.0 1.7 --  Total Bilirubin 0.5 0.4 0.2 0.3 0.3 0.4  Alkaline Phosphatase 69 67 68 72 66 71   AST 35 50 High  29 28 32 50 High   ALT (SGPT) 54 High  77 High  55 High  45 High  46 High  79 High     Allergies   Allergies  Allergen Reactions   Contrast Media [Iodinated Contrast Media] Nausea And Vomiting   Rocephin [Ceftriaxone Sodium In Dextrose] Nausea And Vomiting    Medications Prior to Visit:   Outpatient Medications Prior to Visit  Medication Sig Dispense Refill   albuterol (VENTOLIN HFA) 108 (90 Base) MCG/ACT inhaler Inhale 1 puff into the lungs daily as needed.     aspirin 81 MG chewable tablet Chew 1 tablet (81 mg total) by mouth daily.     Canagliflozin-metFORMIN HCl (INVOKAMET PO) Take 1,000 mg by mouth 2 (two)  times daily. 50-1000     carvedilol (COREG) 6.25 MG tablet Take 2 tablets (12.5 mg total) by mouth 2 (two) times daily. 180 tablet 3   Continuous Blood Gluc Sensor (FREESTYLE LIBRE 3 SENSOR) MISC by Does not apply route.     ergocalciferol (VITAMIN D2) 1.25 MG (50000 UT) capsule Take 50,000 Units by mouth once a week. Sunday     fluticasone (FLONASE) 50 MCG/ACT nasal spray Place 2 sprays into  both nostrils daily.     insulin aspart (NOVOLOG FLEXPEN) 100 UNIT/ML FlexPen Inject 24 Units into the skin 3 (three) times daily with meals. And 10 units with a snack     insulin glargine, 2 Unit Dial, (TOUJEO MAX SOLOSTAR) 300 UNIT/ML Solostar Pen Inject 60 Units into the skin daily.     losartan (COZAAR) 25 MG tablet TAKE 1 TABLET EVERY EVENING 90 tablet 3   nitroGLYCERIN (NITROSTAT) 0.4 MG SL tablet Place 1 tablet (0.4 mg total) under the tongue every 5 (five) minutes as needed for chest pain. 25 tablet 1   nystatin ointment (MYCOSTATIN) Apply 1 application topically 2 (two) times daily as needed. Balanitis     pantoprazole (PROTONIX) 40 MG tablet Take 1 tablet by mouth daily.     rosuvastatin (CRESTOR) 20 MG tablet Take 20 mg by mouth at bedtime.     SUPER B COMPLEX/C PO Take 1 tablet by mouth daily.     zolpidem (AMBIEN) 10 MG tablet Take 5 mg by mouth at bedtime as needed for sleep.   2   No facility-administered medications prior to visit.   Final Medications at End of Visit    Current Meds  Medication Sig   albuterol (VENTOLIN HFA) 108 (90 Base) MCG/ACT inhaler Inhale 1 puff into the lungs daily as needed.   aspirin 81 MG chewable tablet Chew 1 tablet (81 mg total) by mouth daily.   Canagliflozin-metFORMIN HCl (INVOKAMET PO) Take 1,000 mg by mouth 2 (two) times daily. 50-1000   carvedilol (COREG) 6.25 MG tablet Take 2 tablets (12.5 mg total) by mouth 2 (two) times daily.   Continuous Blood Gluc Sensor (FREESTYLE LIBRE 3 SENSOR) MISC by Does not apply route.   ergocalciferol (VITAMIN D2) 1.25  MG (50000 UT) capsule Take 50,000 Units by mouth once a week. Sunday   fluticasone (FLONASE) 50 MCG/ACT nasal spray Place 2 sprays into both nostrils daily.   insulin aspart (NOVOLOG FLEXPEN) 100 UNIT/ML FlexPen Inject 24 Units into the skin 3 (three) times daily with meals. And 10 units with a snack   insulin glargine, 2 Unit Dial, (TOUJEO MAX SOLOSTAR) 300 UNIT/ML Solostar Pen Inject 60 Units into the skin daily.   losartan (COZAAR) 25 MG tablet TAKE 1 TABLET EVERY EVENING   nitroGLYCERIN (NITROSTAT) 0.4 MG SL tablet Place 1 tablet (0.4 mg total) under the tongue every 5 (five) minutes as needed for chest pain.   nystatin ointment (MYCOSTATIN) Apply 1 application topically 2 (two) times daily as needed. Balanitis   pantoprazole (PROTONIX) 40 MG tablet Take 1 tablet by mouth daily.   rosuvastatin (CRESTOR) 20 MG tablet Take 20 mg by mouth at bedtime.   SUPER B COMPLEX/C PO Take 1 tablet by mouth daily.   zolpidem (AMBIEN) 10 MG tablet Take 5 mg by mouth at bedtime as needed for sleep.    Radiology:   No results found. DG Chest Portable 1 View 02/07/2020 CLINICAL DATA:  Chest pain, dizziness, nausea EXAM: PORTABLE CHEST 1 VIEW COMPARISON:  03/03/2018 FINDINGS: Single frontal view of the chest demonstrates an unremarkable cardiac silhouette. No airspace disease, effusion, or pneumothorax. No acute bony abnormality.   Cardiac Studies:   Carotid artery duplex 12/30/20:  No hemodynamically significant arterial disease in the internal carotid artery bilaterally.  Very mild soft plaque noted in the bilateral CCA.  Antegrade right vertebral artery flow. Antegrade left vertebral artery flow.  Ambulatory cardiac telemetry 14 days (11/04/2020 - 11/18/2020): Predominant underlying rhythm was sinus.  Minimum  heart rate 46 bpm, maximum heart rate 124 bpm, average heart rate 60 bpm.  Single episode of supraventricular tachycardia lasting 4 beats.  Frequent PACs (7.7% burden) and occasional PVCs (1.5%  burden).  Single episode of PACs and PVCs were symptomatic, otherwise asymptomatic.  Ventricular bigeminy and trigeminy were present.  No evidence of ventricular tachycardia, atrial fibrillation, high degree AV block, or pauses >3 seconds.  Echo 06/19/2022:  Normal LV systolic function with visual EF 55-60%. Left ventricle cavity is normal in size. Normal left ventricular wall thickness. Normal global wall motion. Normal diastolic filling pattern, normal LAP. Calculated EF 54%. Trileaflet aortic valve with no regurgitation. Mild aortic valve leaflet calcification. Structurally normal tricuspid valve with trace regurgitation. No evidence of pulmonary hypertension. No significant change compared to 12/2020.    Coronary angiography and angioplasty 02/07/2020: RCA: Dominant, mild disease throughout. Left main: Smooth and normal. Circumflex: Moderate sized, mild diffuse disease. Ramus intermediate: Mild disease.  Small. LAD: Large vessel.  Occluded after the origin of a large D1.  Successful thrombectomy followed by overlapping 4.0 x 38 resolute Onyx and 3.5 x 12 mm Synergy DES deployed at 12 atmospheric pressure each, distal stent was postdilated with the same stent balloon at 16 atmospheric pressure at the overlap.  TIMI 0 to TIMI III flow improved, no evidence of Sosa dissection.  100 mL contrast utilized.  06/15/2022 LM: Normal LAD: Patent stent. No significant CAD. LCx: No significant disease RCA: Prox 20% disease  Normal LVEF Mildly elevated LVEDP   Chest pain at rest of non-cardiac etiology.   EKG:  07/02/2022: Sinus Rhythm, 71bpm, without underlying injury pattern.   Assessment     ICD-10-CM   1. Coronary artery disease involving native coronary artery of native heart without angina pectoris  I25.10 EKG 12-Lead    2. History of coronary angioplasty with insertion of stent  Z95.5     3. Hx of ST elevation myocardial infarction  I25.2     4. Type 2 diabetes mellitus with  hyperglycemia, with long-term current use of insulin  E11.65    Z79.4     5. Type 2 diabetes mellitus with hyperlipidemia  E11.69    E78.5     6. Essential hypertension  I10     7. Class 2 severe obesity due to excess calories with serious comorbidity and body mass index (BMI) of 35.0 to 35.9 in adult  E66.01    Z68.35        There are no discontinued medications.   No orders of the defined types were placed in this encounter.    Recommendations:   Cory Sosa is a 67 y.o. male patient with hypertension, hyperlipidemia, diabetes mellitus, HX of STEMI s/p PCI to proximal to mid LAD, obesity.    Coronary artery disease involving native coronary artery of native heart without angina pectoris History of coronary angioplasty with insertion of stent Hx of ST elevation myocardial infarction Denies angina pectoris or heart failure symptoms. EKG: Nonischemic. Echocardiogram: Preserved LVEF, normal diastolic function, defer to report for additional details. Angiography 05/2022: Patent stent, no new obstructive disease. Educated him on the importance of secondary prevention and improving his modifiable cardiovascular risk factors. Blood pressures are well-controlled. Diabetes currently managed by endocrinology LDL and triglyceride levels are currently at goal. He would benefit from Ozempic and given his CAD/diabetes.  He will discuss with endocrinology and start medical therapy. Patient is back exercising at his baseline capacity.  Type 2 diabetes mellitus with hyperglycemia, with long-term current  use of insulin (HCC) Reemphasized importance of glycemic control. Currently on ARB, statin therapy, Invokana.   Type 2 diabetes mellitus with hyperlipidemia (HCC) Currently on rosuvastatin.   He denies myalgia or other side effects. Most recent lipids dated February 2024, independently reviewed as noted above.  Essential hypertension Office and home blood pressures are  well-controlled. No changes in medical therapy. Monitor for now.  Cory Sosa, [place]DO, [facility]olaFACC  Pager:  [phone] Office: [phone]

## 2022-07-20 ENCOUNTER — Ambulatory Visit: Payer: Medicare Other | Admitting: Internal Medicine

## 2022-08-02 IMAGING — DX DG CHEST 1V PORT
1 series · 1 of 1 positions shown · non-contrast
Comparison: 03/03/2018

CLINICAL DATA: Chest pain, dizziness, nausea

EXAM:
PORTABLE CHEST 1 VIEW

[chest]
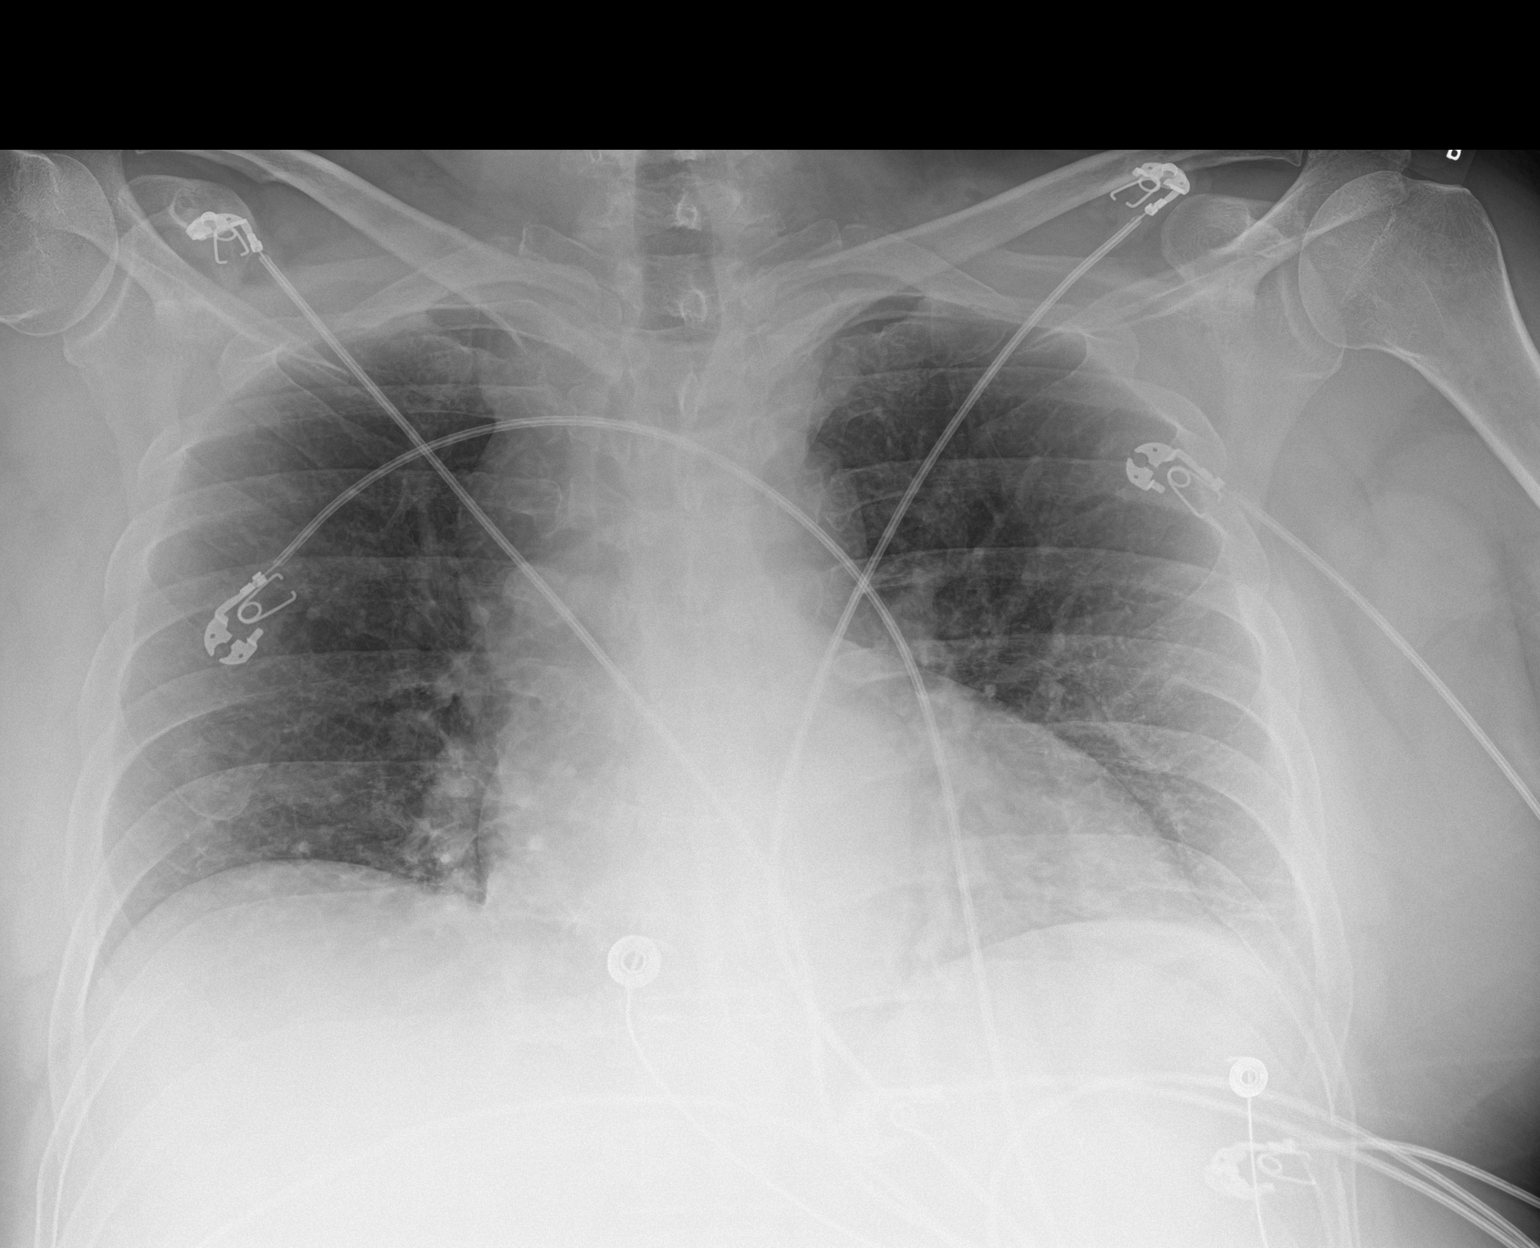

[1 of 1 positions shown; findings below may reference images not displayed]

FINDINGS: Single frontal view of the chest demonstrates an unremarkable
cardiac silhouette. No airspace disease, effusion, or pneumothorax.
No acute bony abnormality.
IMPRESSION: 1. No acute intrathoracic process.

## 2022-09-03 ENCOUNTER — Ambulatory Visit: Payer: Self-pay | Admitting: General Surgery

## 2022-09-03 NOTE — H&P (Signed)
Chief Complaint: New Consultation (BILATERAL INGUINAL HERNIA)       History of Present Illness: Cory Sosa is a 67 y.o. male who is seen today as an office consultation at the request of Dr. Cyndia Bent for evaluation of New Consultation (BILATERAL INGUINAL HERNIA) .   Patient is a 67 year old male, who comes in with history of CAD status post stents, sees Dr. Odis Hollingshead as his cardiologist.   Patient comes today secondary to left-sided inguinal pain that began approximately 5 weeks ago.  He states that this was after lifting an anchor.  He states that he had continued ongoing intermittent pain.  He underwent CT scan and at a [facility] institution.  Results were reviewed with bilateral fat-containing inguinal hernias.  Patient states that since that time he had ongoing intermittent inguinal pain with sneezing, coughing, lifting.   Patient had no previous abdominal surgery.  He had no signs or symptoms of incarceration or strangulation.         Review of Systems: A complete review of systems was obtained from the patient.  I have reviewed this information and discussed as appropriate with the patient.  See HPI as well for other ROS.   ROS      Medical History: Past Medical History Past Medical History: Diagnosis Date  Arthritis    Diabetes mellitus without complication (CMS/HHS-HCC)    GERD (gastroesophageal reflux disease)    Sleep apnea         Problem List There is no problem list on file for this patient.     Past Surgical History Past Surgical History: Procedure Laterality Date  REPLACEMENT TOTAL KNEE      STENT PLACEMENT INTRACRANIAL PERCUTANEOUS          Allergies Allergies Allergen Reactions  Ceftriaxone Vomiting  Insulin Glargine-Lixisenatide Diarrhea  Nitrous Oxide Vomiting      Medications Ordered Prior to Encounter Current Outpatient Medications on File Prior to Visit Medication Sig Dispense Refill  aspirin 81 MG chewable tablet Take 81 mg by mouth once  daily      carvediloL (COREG) 12.5 MG tablet Take 1 tablet by mouth 2 (two) times daily      INVOKAMET XR 150-1,000 mg XR biphasic 24 hr tablet Take 1 tablet by mouth 2 (two) times daily with meals      losartan (COZAAR) 25 MG tablet Take 1 tablet by mouth once daily      NOVOLOG FLEXPEN U-100 INSULIN pen injector (concentration 100 units/mL) INJECT 20 UNITS UNDER THE SKIN WITH MEALS AND 10 UNITS WITH LATE NIGHT SNACK/SUPPER (70 UNITS/DAY TOTAL)      pantoprazole (PROTONIX) 40 MG DR tablet Take 1 tablet by mouth once daily      rosuvastatin (CRESTOR) 20 MG tablet Take 1 tablet by mouth at bedtime      TOUJEO MAX U-300 SOLOSTAR 300 unit/mL (3 mL) InPn INJECT 52 UNITS INTO THE SKIN DAILY.        No current facility-administered medications on file prior to visit.      Family History Family History Problem Relation Age of Onset  High blood pressure (Hypertension) Mother    Diabetes Mother    High blood pressure (Hypertension) Sister    Obesity Sister        Tobacco Use History Social History    Tobacco Use Smoking Status Never Smokeless Tobacco Never      Social History Social History    Socioeconomic History  Marital status: Married Tobacco Use  Smoking status: Never  Smokeless tobacco: Never Vaping Use  Vaping status: Never Used Substance and Sexual Activity  Drug use: Never    Social Determinants of Health    Financial Resource Strain: Low Risk  (06/03/2022)   Received from [facility]   Overall Financial Resource Strain (CARDIA)    Difficulty of Paying Living Expenses: Not hard at all Food Insecurity: No Food Insecurity (06/03/2022)   Received from [facility]   Hunger Vital Sign    Worried About Running Out of Food in the Last Year: Never true    Ran Out of Food in the Last Year: Never true Transportation Needs: No Transportation Needs (06/03/2022)   Received from [facility] - Transportation    Lack of Transportation (Medical): No     Lack of Transportation (Non-Medical): No Physical Activity: Insufficiently Active (06/03/2022)   Received from [facility]   Exercise Vital Sign    Days of Exercise per Week: 2 days    Minutes of Exercise per Session: 20 min Stress: No Stress Concern Present (06/03/2022)   Received from [facility] - Occupational Stress Questionnaire    Feeling of Stress : Not at all Social Connections: Socially Integrated (06/03/2022)   Received from [facility]   Social Network    How would you rate your social network (family, work, friends)?: Good participation with social networks      Objective:     Vitals:   09/03/22 0824 BP: 110/70 Pulse: 85 Temp: 36.7 C (98 F) Weight: (!) 111.4 kg (245 lb 9.6 oz) Height: 177.8 cm (5\' 10" ) PainSc:   2   Body mass index is 35.24 kg/m. Physical Exam Constitutional:      Appearance: Normal appearance.  HENT:     Head: Normocephalic and atraumatic.     Nose: Nose normal. No congestion.     Mouth/Throat:     Mouth: Mucous membranes are moist.     Pharynx: Oropharynx is clear.  Eyes:     Pupils: Pupils are equal, round, and reactive to light.  Cardiovascular:     Rate and Rhythm: Normal rate and regular rhythm.     Pulses: Normal pulses.     Heart sounds: Normal heart sounds. No murmur heard.    No friction rub. No gallop.  Pulmonary:     Effort: Pulmonary effort is normal. No respiratory distress.     Breath sounds: Normal breath sounds. No stridor. No wheezing, rhonchi or rales.  Abdominal:     General: Abdomen is flat.     Hernia: A hernia is present. Hernia is present in the left inguinal area and right inguinal area.     Comments: L>R  Musculoskeletal:        General: Normal range of motion.     Cervical back: Normal range of motion.  Skin:    General: Skin is warm and dry.  Neurological:     General: No focal deficit present.     Mental Status: He is alert and oriented to person,  place, and time.  Psychiatric:        Mood and Affect: Mood normal.        Thought Content: Thought content normal.          Assessment and Plan: Diagnoses and all orders for this visit:   Bilateral inguinal hernia without obstruction or gangrene, recurrence not specified     Cory Sosa is a 67 y.o. male    Will  obtain cardiac clearance and schedule surgery at [facility].   1.  We will proceed to the OR for a laparoscopic bilateral inguinal hernia repair with mesh. 2. All risks and benefits were discussed with the patient, to generally include infection, bleeding, damage to surrounding structures, acute and chronic nerve pain, and recurrence. Alternatives were offered and described.  All questions were answered and the patient voiced understanding of the procedure and wishes to proceed at this point.             No follow-ups on file.   Axel Filler, MD, [facility], [facility]

## 2022-09-19 ENCOUNTER — Telehealth: Payer: Self-pay

## 2022-09-19 ENCOUNTER — Encounter: Payer: Self-pay | Admitting: Cardiology

## 2022-09-19 NOTE — Telephone Encounter (Signed)
Left message informing patient of clearance sent to Dr. Derrell Lolling.

## 2022-10-09 NOTE — Pre-Procedure Instructions (Signed)
Surgical Instructions    Your procedure is scheduled on Wednesday 10/17/22.   Report to [facility] Main Entrance "A" at 07:15 A.M., then check in with the Admitting office.  Call this number if you have problems the morning of surgery:  [phone]   If you have any questions prior to your surgery date call [phone]: Open Monday-Friday 8am-4pm If you experience any cold or flu symptoms such as cough, fever, chills, shortness of breath, etc. between now and your scheduled surgery, please notify us at the above number     Remember:  Do not eat after midnight the night before your surgery  You may drink clear liquids until 06:15 A.M. the morning of your surgery.   Clear liquids allowed are: Water, Non-Citrus Juices (without pulp), Carbonated Beverages, Clear Tea, Black Coffee ONLY (NO MILK, CREAM OR POWDERED CREAMER of any kind), and Gatorade  Patient Instructions  The night before surgery:  No food after midnight. ONLY clear liquids after midnight  The day of surgery (if you have diabetes): Drink ONE (1) 12 oz G2 given to you in your pre admission testing appointment by 06:15 A.M the morning of surgery. Drink in one sitting. Do not sip.  This drink was given to you during your hospital  pre-op appointment visit.  Nothing else to drink after completing the  12 oz bottle of G2.         If you have questions, please contact your surgeon's office.     Take these medicines the morning of surgery with A SIP OF WATER:   carvedilol (COREG)   pantoprazole (PROTONIX)   rosuvastatin (CRESTOR)     Take these medicines if needed:   albuterol (VENTOLIN HFA) 108 (90 Base) MCG/ACT   fluticasone (FLONASE)   nitroGLYCERIN (NITROSTAT)-    As of today, STOP taking any Aspirin (unless otherwise instructed by your surgeon) Aleve, Naproxen, Ibuprofen, Motrin, Advil, Goody's, BC's, all herbal medications, fish oil, and all vitamins.  WHAT DO I DO ABOUT MY DIABETES MEDICATION?   Do not  take oral diabetes medicines (pills) the morning of surgery.  DO NOT TAKE INVOKAMET three days prior to surgery. Last dose should be taken on 10/13/22.   DO NOT TAKE the bedtime dose of insulin aspart (NOVOLOG FLEXPEN) THE NIGHT BEFORE SURGERY.    Take half of your normal dose of insulin glargine, 2 Unit Dial, (TOUJEO MAX SOLOSTAR) the night before surgery. Take 30 units.      The day of surgery, do not take other diabetes injectables, including Byetta (exenatide), Bydureon (exenatide ER), Victoza (liraglutide), or Trulicity (dulaglutide).  If your CBG is greater than 220 mg/dL, the morning of surgery, you may take  of your sliding scale (correction) dose of insulin- insulin aspart (NOVOLOG FLEXPEN).    HOW TO MANAGE YOUR DIABETES BEFORE AND AFTER SURGERY  Why is it important to control my blood sugar before and after surgery? Improving blood sugar levels before and after surgery helps healing and can limit problems. A way of improving blood sugar control is eating a healthy diet by:  Eating less sugar and carbohydrates  Increasing activity/exercise  Talking with your doctor about reaching your blood sugar goals High blood sugars (greater than 180 mg/dL) can raise your risk of infections and slow your recovery, so you will need to focus on controlling your diabetes during the weeks before surgery. Make sure that the doctor who takes care of your diabetes knows about your planned surgery including the date and location.  How do I manage my blood sugar before surgery? Check your blood sugar at least 4 times a day, starting 2 days before surgery, to make sure that the level is not too high or low.  Check your blood sugar the morning of your surgery when you wake up and every 2 hours until you get to the Short Stay unit.  If your blood sugar is less than 70 mg/dL, you will need to treat for low blood sugar: Do not take insulin. Treat a low blood sugar (less than 70 mg/dL) with  cup of  clear juice (cranberry or apple), 4 glucose tablets, OR glucose gel. Recheck blood sugar in 15 minutes after treatment (to make sure it is greater than 70 mg/dL). If your blood sugar is not greater than 70 mg/dL on recheck, call [phone] for further instructions. Report your blood sugar to the short stay nurse when you get to Short Stay.  If you are admitted to the hospital after surgery: Your blood sugar will be checked by the staff and you will probably be given insulin after surgery (instead of oral diabetes medicines) to make sure you have good blood sugar levels. The goal for blood sugar control after surgery is 80-180 mg/dL.            Do not wear jewelry or makeup. Do not wear lotions, powders, perfumes/cologne or deodorant. Do not shave 48 hours prior to surgery.  Men may shave face and neck. Do not bring valuables to the hospital. Do not wear nail polish, gel polish, artificial nails, or any other type of covering on natural nails (fingers and toes) If you have artificial nails or gel coating that need to be removed by a nail salon, please have this removed prior to surgery. Artificial nails or gel coating may interfere with anesthesia's ability to adequately monitor your vital signs.  [place] [facility] is not responsible for any belongings or valuables.    Do NOT Smoke (Tobacco/Vaping)  24 hours prior to your procedure  If you use a CPAP at night, you may bring your mask for your overnight stay.   Contacts, glasses, hearing aids, dentures or partials may not be worn into surgery, please bring cases for these belongings   For patients admitted to the hospital, discharge time will be determined by your treatment team.   Patients discharged the day of surgery will not be allowed to drive home, and someone needs to stay with them for 24 hours.   SURGICAL WAITING ROOM VISITATION Patients having surgery or a procedure may have no more than 2 support people in the waiting area - these  visitors may rotate.   Children under the age of 45 must have an adult with them who is not the patient. If the patient needs to stay at the hospital during part of their recovery, the visitor guidelines for inpatient rooms apply. Pre-op nurse will coordinate an appropriate time for 1 support person to accompany patient in pre-op.  This support person may not rotate.   Please refer to [email or web address] for the visitor guidelines for Inpatients (after your surgery is over and you are in a regular room).    Special instructions:    Oral Hygiene is also important to reduce your risk of infection.  Remember - BRUSH YOUR TEETH THE MORNING OF SURGERY WITH YOUR REGULAR TOOTHPASTE   [place]- Preparing For Surgery  Before surgery, you can play an important role. Because skin is not sterile, your skin needs to  be as free of germs as possible. You can reduce the number of germs on your skin by washing with CHG (chlorahexidine gluconate) Soap before surgery.  CHG is an antiseptic cleaner which kills germs and bonds with the skin to continue killing germs even after washing.     Please do not use if you have an allergy to CHG or antibacterial soaps. If your skin becomes reddened/irritated stop using the CHG.  Do not shave (including legs and underarms) for at least 48 hours prior to first CHG shower. It is OK to shave your face.  Please follow these instructions carefully.     Shower the NIGHT BEFORE SURGERY and the MORNING OF SURGERY with CHG Soap.   If you chose to wash your hair, wash your hair first as usual with your normal shampoo. After you shampoo, rinse your hair and body thoroughly to remove the shampoo.  Then [employer] and genitals (private parts) with your normal soap and rinse thoroughly to remove soap.  After that Use CHG Soap as you would any other liquid soap. You can apply CHG directly to the skin and wash gently with a scrungie  or a clean washcloth.   Apply the CHG Soap to your body ONLY FROM THE NECK DOWN.  Do not use on open wounds or open sores. Avoid contact with your eyes, ears, mouth and genitals (private parts). Wash Face and genitals (private parts)  with your normal soap.   Wash thoroughly, paying special attention to the area where your surgery will be performed.  Thoroughly rinse your body with warm water from the neck down.  DO NOT shower/wash with your normal soap after using and rinsing off the CHG Soap.  Pat yourself dry with a CLEAN TOWEL.  Wear CLEAN PAJAMAS to bed the night before surgery  Place CLEAN SHEETS on your bed the night before your surgery  DO NOT SLEEP WITH PETS.   Day of Surgery:  Take a shower with CHG soap. Wear Clean/Comfortable clothing the morning of surgery Do not apply any deodorants/lotions.   Remember to brush your teeth WITH YOUR REGULAR TOOTHPASTE.    If you received a COVID test during your pre-op visit, it is requested that you wear a mask when out in public, stay away from anyone that may not be feeling well, and notify your surgeon if you develop symptoms. If you have been in contact with anyone that has tested positive in the last 10 days, please notify your surgeon.    Please read over the following fact sheets that you were given.

## 2022-10-10 ENCOUNTER — Encounter (HOSPITAL_COMMUNITY)
Admission: RE | Admit: 2022-10-10 | Discharge: 2022-10-10 | Disposition: A | Discharge status: Home / Self Care | Payer: Medicare Other | Source: Ambulatory Visit | Attending: General Surgery | Admitting: General Surgery

## 2022-10-10 ENCOUNTER — Encounter (HOSPITAL_COMMUNITY): Payer: Self-pay

## 2022-10-10 ENCOUNTER — Other Ambulatory Visit: Payer: Self-pay

## 2022-10-10 VITALS — BP 128/79 | HR 63 | Temp 97.5°F | Resp 18 | Ht 70.0 in | Wt 245.5 lb

## 2022-10-10 DIAGNOSIS — Z01812 Encounter for preprocedural laboratory examination: Secondary | ICD-10-CM | POA: Insufficient documentation

## 2022-10-10 DIAGNOSIS — Z9861 Coronary angioplasty status: Secondary | ICD-10-CM | POA: Diagnosis not present

## 2022-10-10 DIAGNOSIS — Z794 Long term (current) use of insulin: Secondary | ICD-10-CM | POA: Insufficient documentation

## 2022-10-10 DIAGNOSIS — E785 Hyperlipidemia, unspecified: Secondary | ICD-10-CM | POA: Diagnosis not present

## 2022-10-10 DIAGNOSIS — I252 Old myocardial infarction: Secondary | ICD-10-CM | POA: Diagnosis not present

## 2022-10-10 DIAGNOSIS — E119 Type 2 diabetes mellitus without complications: Secondary | ICD-10-CM | POA: Diagnosis not present

## 2022-10-10 DIAGNOSIS — I10 Essential (primary) hypertension: Secondary | ICD-10-CM | POA: Diagnosis not present

## 2022-10-10 DIAGNOSIS — Z01818 Encounter for other preprocedural examination: Secondary | ICD-10-CM

## 2022-10-10 HISTORY — DX: Unspecified osteoarthritis, unspecified site: M19.90

## 2022-10-10 HISTORY — DX: Pneumonia, unspecified organism: J18.9

## 2022-10-10 HISTORY — DX: Gastro-esophageal reflux disease without esophagitis: K21.9

## 2022-10-10 HISTORY — DX: Sleep apnea, unspecified: G47.30

## 2022-10-10 LAB — CBC
HCT: 45.8 % (ref 39.0–52.0)
Hemoglobin: 15 g/dL (ref 13.0–17.0)
MCH: 30.1 pg (ref 26.0–34.0)
MCHC: 32.8 g/dL (ref 30.0–36.0)
MCV: 91.8 fL (ref 80.0–100.0)
Platelets: 284 10*3/uL (ref 150–400)
RBC: 4.99 MIL/uL (ref 4.22–5.81)
RDW: 14.4 % (ref 11.5–15.5)
WBC: 6.9 10*3/uL (ref 4.0–10.5)
nRBC: 0 % (ref 0.0–0.2)

## 2022-10-10 LAB — BASIC METABOLIC PANEL
Anion gap: 6 (ref 5–15)
BUN: 12 mg/dL (ref 8–23)
CO2: 26 mmol/L (ref 22–32)
Calcium: 9.2 mg/dL (ref 8.9–10.3)
Chloride: 106 mmol/L (ref 98–111)
Creatinine, Ser: 1.02 mg/dL (ref 0.61–1.24)
GFR, Estimated: >60 mL/min (ref >60)
Glucose, Bld: 171 mg/dL — ABNORMAL HIGH (ref 70–99)
Potassium: 4.6 mmol/L (ref 3.5–5.1)
Sodium: 138 mmol/L (ref 135–145)

## 2022-10-10 LAB — HEMOGLOBIN A1C
Hgb A1c MFr Bld: 8.5 % — ABNORMAL HIGH (ref 4.8–5.6)
Mean Plasma Glucose: 197.25 mg/dL

## 2022-10-10 LAB — GLUCOSE, CAPILLARY: Glucose-Capillary: 170 mg/dL — ABNORMAL HIGH (ref 70–99)

## 2022-10-10 NOTE — Progress Notes (Signed)
PCP - Patient states former doctor has left practice and is awaiting new doctor at [facility] [place]. Cardiologist - Tessa Lerner, DO  PPM/ICD - Denies  Chest x-ray - 11/16/2021 EKG - 07/02/2022 Stress Test - Patient states it was greater than 10 years ago. Results were normal. ECHO - 06/20/2022 Cardiac Cath - 06/15/2022  Sleep Study - Patient states sleep study was > 10 years ago. Patient given CPAP but does not wear due to CPAP being uncomfortable.  DM: Type II Fasting Blood Sugar: 120-150. Checks Blood Sugar multiple times a day. Patient wears Freestyle Libre currently on the right arm.   Last dose of GLP1 agonist- n/a GLP1 instructions: n/a  Blood Thinner Instructions: n/a Aspirin Instructions: Patient instructed by cardiologist to hold Aspirin 7 days prior to surgery. Last dose on 10/09/2022.  ERAS Protcol - Yes PRE-SURGERY Ensure or G2- G2  COVID TEST- No   Anesthesia review: Yes, cardiac history.  Patient denies shortness of breath, fever, cough and chest pain at PAT appointment   All instructions explained to the patient, with a verbal understanding of the material. Patient agrees to go over the instructions while at home for a better understanding.The opportunity to ask questions was provided.

## 2022-10-11 NOTE — Anesthesia Preprocedure Evaluation (Signed)
Anesthesia Evaluation  Patient identified by MRN, date of birth, ID band Patient awake    Reviewed: Allergy & Precautions, NPO status , Patient's Chart, lab work & pertinent test results, reviewed documented beta blocker date and time   Airway Mallampati: II  TM Distance: >3 FB Neck ROM: Full    Dental no notable dental hx. (+) Teeth Intact, Dental Advisory Given   Pulmonary sleep apnea    Pulmonary exam normal breath sounds clear to auscultation       Cardiovascular hypertension, Pt. on home beta blockers and Pt. on medications + CAD and + Past MI  Normal cardiovascular exam Rhythm:Regular Rate:Normal     Neuro/Psych negative neurological ROS  negative psych ROS   GI/Hepatic Neg liver ROS,GERD  ,,  Endo/Other  diabetes, Type 2, Insulin Dependent    Renal/GU negative Renal ROS  negative genitourinary   Musculoskeletal  (+) Arthritis ,    Abdominal   Peds  Hematology negative hematology ROS (+)   Anesthesia Other Findings   Reproductive/Obstetrics                             Anesthesia Physical Anesthesia Plan  ASA: 3  Anesthesia Plan: General   Post-op Pain Management: Tylenol PO (pre-op)*   Induction: Intravenous  PONV Risk Score and Plan: 2 and Midazolam, Dexamethasone and Ondansetron  Airway Management Planned: Oral ETT  Additional Equipment:   Intra-op Plan:   Post-operative Plan: Extubation in OR  Informed Consent: I have reviewed the patients History and Physical, chart, labs and discussed the procedure including the risks, benefits and alternatives for the proposed anesthesia with the patient or authorized representative who has indicated his/her understanding and acceptance.     Dental advisory given  Plan Discussed with: CRNA  Anesthesia Plan Comments: (PAT note by Antionette Poles, PA-C:  Follows with cardiology for HTN, HLD, STEMI s/p PCI to proximal to mid LAD  01/2020.  Recent cath in March 2024 for evaluation of chest pain showed no significant coronary disease and prior stents were patent.  Seen by Dr. Odis Hollingshead 824 and noted to be doing well at that time.  Per note, "Clinically he denies anginal chest pain or heart failure symptoms. No use of sublingual nitroglycerin tablets after his angiography in March 2024. Also walks 1 to 2 miles per day at work and tries to swim at the [place]." Clearance letter from Dr. Odis Hollingshead dated 09/19/2022 states, "Reford Olliff is at acceptable risk, from a cardiac standpoint, for his upcoming procedure: Hernia surgery.  If applicable can hold Aspirin for 7  day(s) prior to procedure if needed. Restart  Aspirin  post surgery when hemostasis is achieved as per your clinical judgement."  OSA, intolerant to CPAP.  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  Preop labs reviewed, glucose elevated 171 consistent with poorly controlled IDDM 2, otherwise unremarkable.  EKG 07/02/2022: Sinus rhythm.  Rate 71.  Echo 06/19/2022:  Normal LV systolic function with visual EF 55-60%. Left ventricle cavity is normal in size. Normal left ventricular wall thickness. Normal global wall motion. Normal diastolic filling pattern, normal LAP. Calculated EF 54%. Trileaflet aortic valve with no regurgitation. Mild aortic valve leaflet calcification. Structurally normal tricuspid valve with trace regurgitation. No evidence of pulmonary hypertension. No significant change compared to 12/2020.   Cath 06/15/2022 LM: Normal LAD: Patent stent. No significant CAD. LCx: No significant disease RCA: Prox 20% disease  Normal LVEF Mildly elevated LVEDP  Chest pain at rest of non-cardiac etiology.   )        Anesthesia Quick Evaluation

## 2022-10-11 NOTE — Progress Notes (Signed)
Anesthesia Chart Review:  Follows with cardiology for HTN, HLD, STEMI s/p PCI to proximal to mid LAD 01/2020.  Recent cath in March 2024 for evaluation of chest pain showed no significant coronary disease and prior stents were patent.  Seen by Dr. Odis Hollingshead 824 and noted to be doing well at that time.  Per note, "Clinically he denies anginal chest pain or heart failure symptoms. No use of sublingual nitroglycerin tablets after his angiography in March 2024. Also walks 1 to 2 miles per day at work and tries to swim at the [facility]." Clearance letter from Dr. Odis Hollingshead dated 09/19/2022 states, "Cory Sosa is at acceptable risk, from a cardiac standpoint, for his upcoming procedure: Hernia surgery.  If applicable can hold Aspirin for 7  day(s) prior to procedure if needed. Restart  Aspirin  post surgery when hemostasis is achieved as per your clinical judgement."  OSA, intolerant to CPAP.  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  Preop labs reviewed, glucose elevated 171 consistent with poorly controlled IDDM 2, otherwise unremarkable.  EKG 07/02/2022: Sinus rhythm.  Rate 71.  Echo 06/19/2022:  Normal LV systolic function with visual EF 55-60%. Left ventricle cavity is normal in size. Normal left ventricular wall thickness. Normal global wall motion. Normal diastolic filling pattern, normal LAP. Calculated EF 54%. Trileaflet aortic valve with no regurgitation. Mild aortic valve leaflet calcification. Structurally normal tricuspid valve with trace regurgitation. No evidence of pulmonary hypertension. No significant change compared to 12/2020.    Cath 06/15/2022 LM: Normal LAD: Patent stent. No significant CAD. LCx: No significant disease RCA: Prox 20% disease   Normal LVEF Mildly elevated LVEDP   Chest pain at rest of non-cardiac etiology.    [facility] Short Stay Center/Anesthesiology Phone [phone] 10/11/2022 11:18 AM

## 2022-10-17 ENCOUNTER — Encounter (HOSPITAL_COMMUNITY)
Admission: RE | Disposition: A | Discharge status: Home / Self Care | Payer: Self-pay | Source: Home / Self Care | Attending: General Surgery

## 2022-10-17 ENCOUNTER — Ambulatory Visit (HOSPITAL_BASED_OUTPATIENT_CLINIC_OR_DEPARTMENT_OTHER): Payer: Medicare Other | Admitting: Anesthesiology

## 2022-10-17 ENCOUNTER — Other Ambulatory Visit: Payer: Self-pay

## 2022-10-17 ENCOUNTER — Ambulatory Visit (HOSPITAL_COMMUNITY): Payer: Medicare Other | Admitting: Physician Assistant

## 2022-10-17 ENCOUNTER — Ambulatory Visit (HOSPITAL_COMMUNITY)
Admission: RE | Admit: 2022-10-17 | Discharge: 2022-10-17 | Disposition: A | Discharge status: Home / Self Care | Payer: Medicare Other | Attending: General Surgery | Admitting: General Surgery

## 2022-10-17 ENCOUNTER — Encounter (HOSPITAL_COMMUNITY): Payer: Self-pay | Admitting: General Surgery

## 2022-10-17 DIAGNOSIS — I10 Essential (primary) hypertension: Secondary | ICD-10-CM

## 2022-10-17 DIAGNOSIS — I2510 Atherosclerotic heart disease of native coronary artery without angina pectoris: Secondary | ICD-10-CM | POA: Diagnosis not present

## 2022-10-17 DIAGNOSIS — K219 Gastro-esophageal reflux disease without esophagitis: Secondary | ICD-10-CM | POA: Diagnosis not present

## 2022-10-17 DIAGNOSIS — Z794 Long term (current) use of insulin: Secondary | ICD-10-CM | POA: Diagnosis not present

## 2022-10-17 DIAGNOSIS — G4730 Sleep apnea, unspecified: Secondary | ICD-10-CM | POA: Insufficient documentation

## 2022-10-17 DIAGNOSIS — E119 Type 2 diabetes mellitus without complications: Secondary | ICD-10-CM | POA: Insufficient documentation

## 2022-10-17 DIAGNOSIS — K4020 Bilateral inguinal hernia, without obstruction or gangrene, not specified as recurrent: Secondary | ICD-10-CM | POA: Diagnosis not present

## 2022-10-17 DIAGNOSIS — I252 Old myocardial infarction: Secondary | ICD-10-CM | POA: Diagnosis not present

## 2022-10-17 HISTORY — PX: INGUINAL HERNIA REPAIR: SHX194

## 2022-10-17 LAB — GLUCOSE, CAPILLARY
Glucose-Capillary: 217 mg/dL — ABNORMAL HIGH (ref 70–99)
Glucose-Capillary: 199 mg/dL — ABNORMAL HIGH (ref 70–99)

## 2022-10-17 SURGERY — REPAIR, HERNIA, INGUINAL, LAPAROSCOPIC
Anesthesia: General | Site: Inguinal | Laterality: Bilateral

## 2022-10-17 MED ORDER — FENTANYL CITRATE (PF) 100 MCG/2ML IJ SOLN
25.0000 ug | INTRAMUSCULAR | Status: DC | PRN
  Administered 2022-10-17: 50 ug via INTRAVENOUS
  Administered 2022-10-17: 25 ug via INTRAVENOUS

## 2022-10-17 MED ORDER — LIDOCAINE 2% (20 MG/ML) 5 ML SYRINGE
INTRAMUSCULAR | Status: AC
  Filled 2022-10-17: qty 5

## 2022-10-17 MED ORDER — ONDANSETRON HCL 4 MG/2ML IJ SOLN
INTRAMUSCULAR | Status: AC
  Filled 2022-10-17: qty 2

## 2022-10-17 MED ORDER — ACETAMINOPHEN 500 MG PO TABS
1000.0000 mg | ORAL_TABLET | Freq: Once | ORAL | Status: AC
  Administered 2022-10-17: 1000 mg via ORAL
  Filled 2022-10-17: qty 2

## 2022-10-17 MED ORDER — INSULIN ASPART 100 UNIT/ML IJ SOLN
INTRAMUSCULAR | Status: AC
  Filled 2022-10-17: qty 1

## 2022-10-17 MED ORDER — ONDANSETRON HCL 4 MG/2ML IJ SOLN
INTRAMUSCULAR | Status: DC | PRN
Start: 2022-10-17 — End: 2022-10-17
  Administered 2022-10-17: 4 mg via INTRAVENOUS

## 2022-10-17 MED ORDER — ROCURONIUM BROMIDE 10 MG/ML (PF) SYRINGE
PREFILLED_SYRINGE | INTRAVENOUS | Status: AC
  Filled 2022-10-17: qty 10

## 2022-10-17 MED ORDER — INSULIN ASPART 100 UNIT/ML IJ SOLN
0.0000 [IU] | INTRAMUSCULAR | Status: DC | PRN
  Administered 2022-10-17: 4 [IU] via SUBCUTANEOUS

## 2022-10-17 MED ORDER — CHLORHEXIDINE GLUCONATE 0.12 % MT SOLN: 15.0000 mL | Freq: Once | OROMUCOSAL | Status: AC

## 2022-10-17 MED ORDER — ORAL CARE MOUTH RINSE: 15.0000 mL | Freq: Once | OROMUCOSAL | Status: AC

## 2022-10-17 MED ORDER — BUPIVACAINE HCL (PF) 0.25 % IJ SOLN
INTRAMUSCULAR | Status: AC
  Filled 2022-10-17: qty 30

## 2022-10-17 MED ORDER — CHLORHEXIDINE GLUCONATE 0.12 % MT SOLN
OROMUCOSAL | Status: AC
  Administered 2022-10-17: 15 mL via OROMUCOSAL
  Filled 2022-10-17: qty 15

## 2022-10-17 MED ORDER — MIDAZOLAM HCL 2 MG/2ML IJ SOLN
INTRAMUSCULAR | Status: AC
  Filled 2022-10-17: qty 2

## 2022-10-17 MED ORDER — LACTATED RINGERS IV SOLN: INTRAVENOUS | Status: DC

## 2022-10-17 MED ORDER — VANCOMYCIN HCL 1500 MG/300ML IV SOLN: 1500.0000 mg | INTRAVENOUS | Status: AC

## 2022-10-17 MED ORDER — FENTANYL CITRATE (PF) 250 MCG/5ML IJ SOLN
INTRAMUSCULAR | Status: AC
  Filled 2022-10-17: qty 5

## 2022-10-17 MED ORDER — SUGAMMADEX SODIUM 200 MG/2ML IV SOLN
INTRAVENOUS | Status: DC | PRN
  Administered 2022-10-17: 200 mg via INTRAVENOUS

## 2022-10-17 MED ORDER — DEXAMETHASONE SODIUM PHOSPHATE 10 MG/ML IJ SOLN
INTRAMUSCULAR | Status: DC | PRN
  Administered 2022-10-17: 5 mg via INTRAVENOUS

## 2022-10-17 MED ORDER — ROCURONIUM BROMIDE 10 MG/ML (PF) SYRINGE
PREFILLED_SYRINGE | INTRAVENOUS | Status: DC | PRN
  Administered 2022-10-17: 50 mg via INTRAVENOUS

## 2022-10-17 MED ORDER — LIDOCAINE 2% (20 MG/ML) 5 ML SYRINGE
INTRAMUSCULAR | Status: DC | PRN
  Administered 2022-10-17: 60 mg via INTRAVENOUS

## 2022-10-17 MED ORDER — PROPOFOL 10 MG/ML IV BOLUS
INTRAVENOUS | Status: DC | PRN
Start: 2022-10-17 — End: 2022-10-17
  Administered 2022-10-17: 150 mg via INTRAVENOUS

## 2022-10-17 MED ORDER — PHENYLEPHRINE HCL-NACL 20-0.9 MG/250ML-% IV SOLN
INTRAVENOUS | Status: DC | PRN
  Administered 2022-10-17: 25 ug/min via INTRAVENOUS

## 2022-10-17 MED ORDER — EPHEDRINE SULFATE-NACL 50-0.9 MG/10ML-% IV SOSY
PREFILLED_SYRINGE | INTRAVENOUS | Status: DC | PRN
  Administered 2022-10-17: 10 mg via INTRAVENOUS

## 2022-10-17 MED ORDER — ACETAMINOPHEN 500 MG PO TABS: 1000.0000 mg | ORAL_TABLET | ORAL | Status: DC

## 2022-10-17 MED ORDER — CHLORHEXIDINE GLUCONATE CLOTH 2 % EX PADS: 6.0000 | MEDICATED_PAD | Freq: Once | CUTANEOUS | Status: DC

## 2022-10-17 MED ORDER — TRAMADOL HCL 50 MG PO TABS: 50.0000 mg | ORAL_TABLET | Freq: Four times a day (QID) | ORAL | 0 refills | Status: AC | PRN

## 2022-10-17 MED ORDER — DEXAMETHASONE SODIUM PHOSPHATE 10 MG/ML IJ SOLN
INTRAMUSCULAR | Status: AC
  Filled 2022-10-17: qty 1

## 2022-10-17 MED ORDER — ENSURE PRE-SURGERY PO LIQD: 296.0000 mL | Freq: Once | ORAL | Status: DC

## 2022-10-17 MED ORDER — VANCOMYCIN HCL 1500 MG/300ML IV SOLN
INTRAVENOUS | Status: AC
  Administered 2022-10-17: 1500 mg via INTRAVENOUS
  Filled 2022-10-17: qty 300

## 2022-10-17 MED ORDER — PROPOFOL 10 MG/ML IV BOLUS
INTRAVENOUS | Status: AC
  Filled 2022-10-17: qty 20

## 2022-10-17 MED ORDER — MIDAZOLAM HCL 5 MG/5ML IJ SOLN
INTRAMUSCULAR | Status: DC | PRN
  Administered 2022-10-17: 2 mg via INTRAVENOUS

## 2022-10-17 MED ORDER — FENTANYL CITRATE (PF) 250 MCG/5ML IJ SOLN
INTRAMUSCULAR | Status: DC | PRN
  Administered 2022-10-17: 50 ug via INTRAVENOUS
  Administered 2022-10-17: 100 ug via INTRAVENOUS
  Administered 2022-10-17 (×2): 50 ug via INTRAVENOUS

## 2022-10-17 MED ORDER — BUPIVACAINE HCL 0.25 % IJ SOLN
INTRAMUSCULAR | Status: DC | PRN
  Administered 2022-10-17: 9 mL

## 2022-10-17 MED ORDER — EPHEDRINE 5 MG/ML INJ
INTRAVENOUS | Status: AC
  Filled 2022-10-17: qty 5

## 2022-10-17 MED ORDER — 0.9 % SODIUM CHLORIDE (POUR BTL) OPTIME
TOPICAL | Status: DC | PRN
  Administered 2022-10-17: 1000 mL

## 2022-10-17 MED ORDER — FENTANYL CITRATE (PF) 100 MCG/2ML IJ SOLN
INTRAMUSCULAR | Status: AC
  Filled 2022-10-17: qty 2

## 2022-10-17 SURGICAL SUPPLY — 42 items
ADH SKN CLS APL DERMABOND .7 (GAUZE/BANDAGES/DRESSINGS) ×1
BAG COUNTER SPONGE SURGICOUNT (BAG) ×1 IMPLANT
BAG SPNG CNTER NS LX DISP (BAG) ×1
CANISTER SUCT 3000ML PPV (MISCELLANEOUS) IMPLANT
COVER SURGICAL LIGHT HANDLE (MISCELLANEOUS) ×1 IMPLANT
DERMABOND ADVANCED .7 DNX12 (GAUZE/BANDAGES/DRESSINGS) ×1 IMPLANT
DISSECTOR BLUNT TIP ENDO 5MM (MISCELLANEOUS) IMPLANT
ELECT REM PT RETURN 9FT ADLT (ELECTROSURGICAL) ×1
ELECTRODE REM PT RTRN 9FT ADLT (ELECTROSURGICAL) ×1 IMPLANT
GLOVE BIO SURGEON STRL SZ7.5 (GLOVE) ×2 IMPLANT
GOWN STRL REUS W/ TWL LRG LVL3 (GOWN DISPOSABLE) ×2 IMPLANT
GOWN STRL REUS W/ TWL XL LVL3 (GOWN DISPOSABLE) ×1 IMPLANT
GOWN STRL REUS W/TWL LRG LVL3 (GOWN DISPOSABLE) ×2
GOWN STRL REUS W/TWL XL LVL3 (GOWN DISPOSABLE) ×1
IRRIG SUCT STRYKERFLOW 2 WTIP (MISCELLANEOUS)
IRRIGATION SUCT STRKRFLW 2 WTP (MISCELLANEOUS) IMPLANT
KIT BASIN OR (CUSTOM PROCEDURE TRAY) ×1 IMPLANT
KIT TURNOVER KIT B (KITS) ×1 IMPLANT
MESH 3DMAX 5X7 LT XLRG (Mesh General) IMPLANT
MESH 3DMAX 5X7 RT XLRG (Mesh General) IMPLANT
NDL INSUFFLATION 14GA 120MM (NEEDLE) IMPLANT
NEEDLE INSUFFLATION 14GA 120MM (NEEDLE) IMPLANT
NS IRRIG 1000ML POUR BTL (IV SOLUTION) ×1 IMPLANT
PAD ARMBOARD 7.5X6 YLW CONV (MISCELLANEOUS) ×2 IMPLANT
RELOAD STAPLE 4.0 BLU F/HERNIA (INSTRUMENTS) IMPLANT
RELOAD STAPLE 4.8 BLK F/HERNIA (STAPLE) IMPLANT
RELOAD STAPLE HERNIA 4.0 BLUE (INSTRUMENTS) IMPLANT
RELOAD STAPLE HERNIA 4.8 BLK (STAPLE) IMPLANT
SCISSORS LAP 5X35 DISP (ENDOMECHANICALS) ×1 IMPLANT
SET TUBE SMOKE EVAC HIGH FLOW (TUBING) ×1 IMPLANT
STAPLER HERNIA 12 8.5 360D (INSTRUMENTS) IMPLANT
SUT MNCRL AB 4-0 PS2 18 (SUTURE) ×1 IMPLANT
SUT VIC AB 1 CT1 27 (SUTURE)
SUT VIC AB 1 CT1 27XBRD ANBCTR (SUTURE) IMPLANT
TOWEL GREEN STERILE (TOWEL DISPOSABLE) ×1 IMPLANT
TOWEL GREEN STERILE FF (TOWEL DISPOSABLE) ×1 IMPLANT
TRAY LAPAROSCOPIC MC (CUSTOM PROCEDURE TRAY) ×1 IMPLANT
TROCAR OPTICAL SHORT 5MM (TROCAR) ×1 IMPLANT
TROCAR OPTICAL SLV SHORT 5MM (TROCAR) ×1 IMPLANT
TROCAR Z THREAD OPTICAL 12X100 (TROCAR) ×1 IMPLANT
WARMER LAPAROSCOPE (MISCELLANEOUS) ×1 IMPLANT
WATER STERILE IRR 1000ML POUR (IV SOLUTION) ×1 IMPLANT

## 2022-10-17 NOTE — Anesthesia Procedure Notes (Signed)
Procedure Name: Intubation Date/Time: 10/17/2022 8:49 AM  Performed by: Quentin Ore, CRNAPre-anesthesia Checklist: Patient identified, Emergency Drugs available, Suction available and Patient being monitored Patient Re-evaluated:Patient Re-evaluated prior to induction Oxygen Delivery Method: Circle system utilized Preoxygenation: Pre-oxygenation with 100% oxygen Induction Type: IV induction Ventilation: Mask ventilation without difficulty Laryngoscope Size: Mac and 3 Grade View: Grade II Tube type: Oral Tube size: 7.5 mm Number of attempts: 1 Airway Equipment and Method: Stylet Placement Confirmation: ETT inserted through vocal cords under direct vision, positive ETCO2 and breath sounds checked- equal and bilateral Secured at: 23 cm Tube secured with: Tape Dental Injury: Teeth and Oropharynx as per pre-operative assessment

## 2022-10-17 NOTE — Transfer of Care (Signed)
Immediate Anesthesia Transfer of Care Note  Patient: Cory Sosa  Procedure(s) Performed: LAPAROSCOPIC BILATERAL INGUINAL HERNIA REPAIR WITH MESH (Bilateral: Inguinal)  Patient Location: PACU  Anesthesia Type:General  Level of Consciousness: awake, alert , and oriented  Airway & Oxygen Therapy: Patient Spontanous Breathing  Post-op Assessment: Report given to RN, Post -op Vital signs reviewed and stable, and Patient moving all extremities X 4  Post vital signs: Reviewed and stable  Last Vitals:  Vitals Value Taken Time  BP 147/77 10/17/22 1000  Temp 36.2 C 10/17/22 1000  Pulse 73 10/17/22 1004  Resp 18 10/17/22 1004  SpO2 94 % 10/17/22 1004  Vitals shown include unfiled device data.  Last Pain:  Vitals:   10/17/22 0719  PainSc: 0-No pain         Complications: No notable events documented.

## 2022-10-17 NOTE — Anesthesia Postprocedure Evaluation (Signed)
Anesthesia Post Note  Patient: Cory Sosa  Procedure(s) Performed: LAPAROSCOPIC BILATERAL INGUINAL HERNIA REPAIR WITH MESH (Bilateral: Inguinal)     Patient location during evaluation: PACU Anesthesia Type: General Level of consciousness: awake and alert Pain management: pain level controlled Vital Signs Assessment: post-procedure vital signs reviewed and stable Respiratory status: spontaneous breathing, nonlabored ventilation, respiratory function stable and patient connected to nasal cannula oxygen Cardiovascular status: blood pressure returned to baseline and stable Postop Assessment: no apparent nausea or vomiting Anesthetic complications: no  No notable events documented.  Last Vitals:  Vitals:   10/17/22 1045 10/17/22 1100  BP: 134/81 133/77  Pulse: 63 65  Resp: 14 12  Temp:  36.4 C  SpO2: 93% 93%    Last Pain:  Vitals:   10/17/22 1100  PainSc: 3                  Kala Ambriz L Kellyn Mansfield

## 2022-10-17 NOTE — Op Note (Signed)
10/17/2022  9:48 AM  PATIENT:  [name]  67 y.o. male  PRE-OPERATIVE DIAGNOSIS:  BILATERAL INGUINAL HERNIA  POST-OPERATIVE DIAGNOSIS:  BILATERAL INGUINAL INDIRECT HERNIAS  PROCEDURE:  Procedure(s): LAPAROSCOPIC BILATERAL INGUINAL HERNIA REPAIR WITH MESH (Bilateral)  SURGEON:  Surgeons and Role:    Axel Filler, MD - Primary  ANESTHESIA:   local and general  EBL:  minimal   BLOOD ADMINISTERED:none  DRAINS: none   LOCAL MEDICATIONS USED:  BUPIVICAINE   SPECIMEN:  No Specimen  DISPOSITION OF SPECIMEN:  N/A  COUNTS:  YES  TOURNIQUET:  * No tourniquets in log *  DICTATION: .Dragon Dictation    Counts: reported as correct x 2  Findings:  The patient had a small bilateral indirect hernias  Indications for procedure:  The patient is a 67 year old male with a bilateral inguinal hernias for several months. Patient complained of symptomatology to his bilateral inguinal areas. The patient was taken back for elective inguinal hernia repair.  Details of the procedure: The patient was taken back to the operating room. The patient was placed in supine position with bilateral SCDs in place.  The patient underwent GETA.  A foley catheter was placed. The patient was prepped and draped in the usual sterile fashion.  After appropriate anitbiotics were confirmed, a time-out was confirmed and all facts were verified.  0.25% Marcaine was used to infiltrate the umbilical area. A 11-blade was used to cut down the skin and blunt dissection was used to get the anterior fashion.  The anterior fascia was incised approximately 1 cm and the muscles were retracted laterally. Blunt dissection was then used to create a space in the preperitoneal area. At this time a 10 mm camera was then introduced into the space and advanced the pubic tubercle and a 12 mm trocar was placed over this and insufflation was started.  At this time and space was created from medial to laterally the preperitoneal space.   Cooper's ligament was initially cleaned off.  The hernia sac was identified and dissected away from the cremesteric muscle fibers.  The hernia was seen in the indirect space. Dissection of the hernia sac was undertaken the vas deferens was identified and protected in all parts of the case.   Once the hernia sac was taken down to approximately the umbilicus a Bard 3D Max mesh, size: Barney Drain, was  introduced into the preperitoneal space.  The mesh was brought over to cover the direct and indirect hernia spaces.  This was anchored into place and secured to Cooper's ligament with 4.21mm staples from a Cov[place] hernia stapler. It was anchored to the anterior abdominal wall with 4.8 mm staples. The hernia sac was seen lying posterior to the mesh. There was no staples placed laterally.   The exact same dissection took place on the opposite side. The spermatic cord was identified.  The hernia sac was identified and dissected away from the cremesteric muscle fibers.  The hernia was seen in the indirect space. Dissection of the hernia sac was undertaken the vas deferens was identified and protected in all parts of the case.   The insufflation was evacuated and the peritoneum was seen posterior to the mesh bilaterally. The trochars were removed. The anterior fascia was reapproximated using #1 Vicryl on a UR- 6.  Intra-abdominal air was evacuated and the Veress needle removed. The skin was reapproximated using 4-0 Monocryl subcuticular fashion and was dressed with Dermabond. The  patient was awakened from general anesthesia and taken to recovery  in stable condition.   PLAN OF CARE: Discharge to home after PACU  PATIENT DISPOSITION:  PACU - hemodynamically stable.   Delay start of Pharmacological VTE agent (>24hrs) due to surgical blood loss or risk of bleeding: not applicable

## 2022-10-17 NOTE — H&P (Signed)
Chief Complaint: New Consultation (BILATERAL INGUINAL HERNIA)       History of Present Illness: Cory Sosa is a 67 y.o. male who is seen today as an office consultation at the request of Dr. Cyndia Bent for evaluation of New Consultation (BILATERAL INGUINAL HERNIA) .   Patient is a 67 year old male, who comes in with history of CAD status post stents, sees Dr. Odis Hollingshead as his cardiologist.   Patient comes today secondary to left-sided inguinal pain that began approximately 5 weeks ago.  He states that this was after lifting an anchor.  He states that he had continued ongoing intermittent pain.  He underwent CT scan and at a [facility] institution.  Results were reviewed with bilateral fat-containing inguinal hernias.  Patient states that since that time he had ongoing intermittent inguinal pain with sneezing, coughing, lifting.   Patient had no previous abdominal surgery.  He had no signs or symptoms of incarceration or strangulation.         Review of Systems: A complete review of systems was obtained from the patient.  I have reviewed this information and discussed as appropriate with the patient.  See HPI as well for other ROS.   ROS      Medical History: Past Medical History Past Medical History: Diagnosis Date  Arthritis    Diabetes mellitus without complication (CMS/HHS-HCC)    GERD (gastroesophageal reflux disease)    Sleep apnea         Problem List There is no problem list on file for this patient.     Past Surgical History Past Surgical History: Procedure Laterality Date  REPLACEMENT TOTAL KNEE      STENT PLACEMENT INTRACRANIAL PERCUTANEOUS          Allergies Allergies Allergen Reactions  Ceftriaxone Vomiting  Insulin Glargine-Lixisenatide Diarrhea  Nitrous Oxide Vomiting      Medications Ordered Prior to Encounter Current Outpatient Medications on File Prior to Visit Medication Sig Dispense Refill  aspirin 81 MG chewable tablet Take 81 mg by mouth once  daily      carvediloL (COREG) 12.5 MG tablet Take 1 tablet by mouth 2 (two) times daily      INVOKAMET XR 150-1,000 mg XR biphasic 24 hr tablet Take 1 tablet by mouth 2 (two) times daily with meals      losartan (COZAAR) 25 MG tablet Take 1 tablet by mouth once daily      NOVOLOG FLEXPEN U-100 INSULIN pen injector (concentration 100 units/mL) INJECT 20 UNITS UNDER THE SKIN WITH MEALS AND 10 UNITS WITH LATE NIGHT SNACK/SUPPER (70 UNITS/DAY TOTAL)      pantoprazole (PROTONIX) 40 MG DR tablet Take 1 tablet by mouth once daily      rosuvastatin (CRESTOR) 20 MG tablet Take 1 tablet by mouth at bedtime      TOUJEO MAX U-300 SOLOSTAR 300 unit/mL (3 mL) InPn INJECT 52 UNITS INTO THE SKIN DAILY.        No current facility-administered medications on file prior to visit.      Family History Family History Problem Relation Age of Onset  High blood pressure (Hypertension) Mother    Diabetes Mother    High blood pressure (Hypertension) Sister    Obesity Sister        Tobacco Use History Social History    Tobacco Use Smoking Status Never Smokeless Tobacco Never      Social History Social History    Socioeconomic History  Marital status: Married Tobacco Use  Smoking status: Never  Smokeless tobacco: Never Vaping Use  Vaping status: Never Used Substance and Sexual Activity  Drug use: Never    Social Determinants of Health    Financial Resource Strain: Low Risk  (06/03/2022)   Received from [facility]   Overall Financial Resource Strain (CARDIA)    Difficulty of Paying Living Expenses: Not hard at all Food Insecurity: No Food Insecurity (06/03/2022)   Received from [facility]   Hunger Vital Sign    Worried About Running Out of Food in the Last Year: Never true    Ran Out of Food in the Last Year: Never true Transportation Needs: No Transportation Needs (06/03/2022)   Received from [facility] - Transportation    Lack of Transportation (Medical): No     Lack of Transportation (Non-Medical): No Physical Activity: Insufficiently Active (06/03/2022)   Received from [facility]   Exercise Vital Sign    Days of Exercise per Week: 2 days    Minutes of Exercise per Session: 20 min Stress: No Stress Concern Present (06/03/2022)   Received from [facility] - Occupational Stress Questionnaire    Feeling of Stress : Not at all Social Connections: Socially Integrated (06/03/2022)   Received from [facility]   Social Network    How would you rate your social network (family, work, friends)?: Good participation with social networks      Objective:     Vitals:   09/03/22 0824 BP: 110/70 Pulse: 85 Temp: 36.7 C (98 F) Weight: (!) 111.4 kg (245 lb 9.6 oz) Height: 177.8 cm (5\' 10" ) PainSc:   2   Body mass index is 35.24 kg/m. Physical Exam Constitutional:      Appearance: Normal appearance.  HENT:     Head: Normocephalic and atraumatic.     Nose: Nose normal. No congestion.     Mouth/Throat:     Mouth: Mucous membranes are moist.     Pharynx: Oropharynx is clear.  Eyes:     Pupils: Pupils are equal, round, and reactive to light.  Cardiovascular:     Rate and Rhythm: Normal rate and regular rhythm.     Pulses: Normal pulses.     Heart sounds: Normal heart sounds. No murmur heard.    No friction rub. No gallop.  Pulmonary:     Effort: Pulmonary effort is normal. No respiratory distress.     Breath sounds: Normal breath sounds. No stridor. No wheezing, rhonchi or rales.  Abdominal:     General: Abdomen is flat.     Hernia: A hernia is present. Hernia is present in the left inguinal area and right inguinal area.     Comments: L>R  Musculoskeletal:        General: Normal range of motion.     Cervical back: Normal range of motion.  Skin:    General: Skin is warm and dry.  Neurological:     General: No focal deficit present.     Mental Status: He is alert and oriented to person,  place, and time.  Psychiatric:        Mood and Affect: Mood normal.        Thought Content: Thought content normal.          Assessment and Plan: Diagnoses and all orders for this visit:   Bilateral inguinal hernia without obstruction or gangrene, recurrence not specified     Jordi Mcgillen is a 67 y.o. male    Will  obtain cardiac clearance and schedule surgery at [facility].   1.  We will proceed to the OR for a laparoscopic bilateral inguinal hernia repair with mesh. 2. All risks and benefits were discussed with the patient, to generally include infection, bleeding, damage to surrounding structures, acute and chronic nerve pain, and recurrence. Alternatives were offered and described.  All questions were answered and the patient voiced understanding of the procedure and wishes to proceed at this point.             No follow-ups on file.   Axel Filler, MD, [facility], [facility]

## 2022-10-17 NOTE — Discharge Instructions (Signed)

## 2022-10-18 ENCOUNTER — Encounter (HOSPITAL_COMMUNITY): Payer: Self-pay | Admitting: General Surgery

## 2022-12-25 ENCOUNTER — Other Ambulatory Visit: Payer: Self-pay | Admitting: General Surgery

## 2022-12-25 DIAGNOSIS — R1030 Lower abdominal pain, unspecified: Secondary | ICD-10-CM

## 2022-12-25 DIAGNOSIS — R102 Pelvic and perineal pain: Secondary | ICD-10-CM

## 2022-12-28 ENCOUNTER — Ambulatory Visit
Admission: RE | Admit: 2022-12-28 | Discharge: 2022-12-28 | Disposition: A | Discharge status: Home / Self Care | Payer: Medicare Other | Source: Ambulatory Visit | Attending: General Surgery | Admitting: General Surgery

## 2022-12-28 DIAGNOSIS — R1030 Lower abdominal pain, unspecified: Secondary | ICD-10-CM

## 2022-12-28 DIAGNOSIS — R102 Pelvic and perineal pain: Secondary | ICD-10-CM

## 2022-12-28 MED ORDER — IOPAMIDOL (ISOVUE-300) INJECTION 61%
100.0000 mL | Freq: Once | INTRAVENOUS | Status: AC | PRN
  Administered 2022-12-28: 100 mL via INTRAVENOUS

## 2023-01-01 ENCOUNTER — Encounter: Payer: Self-pay | Admitting: Cardiology

## 2023-01-01 ENCOUNTER — Ambulatory Visit: Payer: Medicare Other | Attending: Cardiology | Admitting: Cardiology

## 2023-01-01 VITALS — BP 128/74 | HR 62 | Resp 16 | Ht 70.0 in | Wt 248.4 lb

## 2023-01-01 DIAGNOSIS — I252 Old myocardial infarction: Secondary | ICD-10-CM

## 2023-01-01 DIAGNOSIS — E785 Hyperlipidemia, unspecified: Secondary | ICD-10-CM | POA: Diagnosis present

## 2023-01-01 DIAGNOSIS — E1165 Type 2 diabetes mellitus with hyperglycemia: Secondary | ICD-10-CM

## 2023-01-01 DIAGNOSIS — Z955 Presence of coronary angioplasty implant and graft: Secondary | ICD-10-CM

## 2023-01-01 DIAGNOSIS — I10 Essential (primary) hypertension: Secondary | ICD-10-CM

## 2023-01-01 DIAGNOSIS — I2510 Atherosclerotic heart disease of native coronary artery without angina pectoris: Secondary | ICD-10-CM | POA: Diagnosis not present

## 2023-01-01 DIAGNOSIS — Z794 Long term (current) use of insulin: Secondary | ICD-10-CM | POA: Diagnosis present

## 2023-01-01 DIAGNOSIS — E1169 Type 2 diabetes mellitus with other specified complication: Secondary | ICD-10-CM

## 2023-01-01 NOTE — Patient Instructions (Signed)
Medication Instructions:  Your physician recommends that you continue on your current medications as directed. Please refer to the Current Medication list given to you today.  *If you need a refill on your cardiac medications before your next appointment, please call your pharmacy*  Lab Work: None ordered today.  Testing/Procedures: None ordered today.  Follow-Up: At [facility], you and your health needs are our priority.  As part of our continuing mission to provide you with exceptional heart care, we have created designated Provider Care Teams.  These Care Teams include your primary Cardiologist (physician) and Advanced Practice Providers (APPs -  Physician Assistants and Nurse Practitioners) who all work together to provide you with the care you need, when you need it.  Your next appointment:   12 month(s)  The format for your next appointment:   In Person  Provider:   Tessa Lerner, DO {

## 2023-01-01 NOTE — Progress Notes (Signed)
Cardiology Office Note:  .   Date:  01/01/2023  ID:  Cory Sosa, DOB 1955-08-13, MRN [id number] PCP:  [name], MD  Former Cardiology Providers: NA [facility] Providers Cardiologist:  Tessa Lerner, DO , [facility] (established care 01/01/23) Electrophysiologist:  None  Click to update primary MD,subspecialty MD or APP then REFRESH:1}    History of Present Illness: .   Cory Sosa is a 67 y.o. [place] male whose past medical history and cardiovascular risk factors includes: hypertension, hyperlipidemia, diabetes mellitus, HX of STEMI s/p PCI to proximal to mid LAD, obesity.   History of anterior wall STEMI in November 2021 requiring PCI to the proximal/mid LAD.Marland Kitchen  He recently underwent left heart catheterization for concerns for unstable angina and was noted to have no significant epicardial coronary disease and prior stents were patent.  Since last office visit he denies any anginal chest pain or heart failure symptoms.  He had recent labs at [facility] which were reviewed independently at today's visit via Care Everywhere.  He has an appointment to see his endocrinologist in the next couple weeks and will discuss initiation of Ozempic given his diabetes and CAD.  Review of Systems: .   Review of Systems  Cardiovascular:  Negative for chest pain, claudication, dyspnea on exertion, irregular heartbeat, leg swelling, near-syncope, orthopnea, palpitations, paroxysmal nocturnal dyspnea and syncope.  Respiratory:  Negative for shortness of breath.   Hematologic/Lymphatic: Negative for bleeding problem.  Musculoskeletal:  Negative for muscle cramps and myalgias.  Neurological:  Negative for dizziness and light-headedness.    Studies Reviewed:   EKG: EKG Interpretation Date/Time:  Tuesday January 01 2023 09:13:08 EDT Ventricular Rate:  62 PR Interval:  190 QRS Duration:  94 QT Interval:  424 QTC Calculation: 430 R Axis:   23  Text Interpretation: Normal sinus rhythm  Normal ECG When compared with ECG of 08-Feb-2020 06:32, Nonspecific T wave abnormality no longer evident in Anterolateral leads Confirmed by Tessa Lerner [phone]) on 01/01/2023 9:26:15 AM  Echocardiogram: 06/19/2022:  Normal LV systolic function with visual EF 55-60%. Left ventricle cavity is normal in size. Normal left ventricular wall thickness. Normal global wall motion. Normal diastolic filling pattern, normal LAP. Calculated EF 54%. Trileaflet aortic valve with no regurgitation. Mild aortic valve leaflet calcification. Structurally normal tricuspid valve with trace regurgitation. No evidence of pulmonary hypertension. No significant change compared to 12/2020.   Heart Catheterization: 06/15/2022 LM: Normal LAD: Patent stent. No significant CAD. LCx: No significant disease RCA: Prox 20% disease  Normal LVEF Mildly elevated LVEDP  Carotid duplex: Carotid artery duplex 12/01/2020:  No hemodynamically significant arterial disease in the internal carotid artery bilaterally.  Very mild soft plaque noted in the bilateral CCA.  Antegrade right vertebral artery flow. Antegrade left vertebral artery flow.   RADIOLOGY: N/A  Risk Assessment/Calculations:   N/A   Labs:       Latest Ref Rng & Units 10/10/2022   10:42 AM 06/15/2022    2:01 PM 06/15/2022   12:01 PM  CBC  WBC 4.0 - 10.5 K/uL 6.9   8.2   Hemoglobin 13.0 - 17.0 g/dL 64.3  32.9  51.8   Hematocrit 39.0 - 52.0 % 45.8  44.0  47.3   Platelets 150 - 400 K/uL 284   299        Latest Ref Rng & Units 10/10/2022   10:42 AM 06/15/2022    2:01 PM 06/15/2022   12:01 PM  BMP  Glucose 70 - 99 mg/dL 841  123  135   BUN 8 - 23 mg/dL 12  13  13    Creatinine 0.61 - 1.24 mg/dL 0.86  5.78  4.69   Sodium 135 - 145 mmol/L 138  141  139   Potassium 3.5 - 5.1 mmol/L 4.6  4.3  5.7   Chloride 98 - 111 mmol/L 106  103  104   CO2 22 - 32 mmol/L 26   24   Calcium 8.9 - 10.3 mg/dL 9.2   9.4       Latest Ref Rng & Units 10/10/2022   10:42 AM  06/15/2022    2:01 PM 06/15/2022   12:01 PM  CMP  Glucose 70 - 99 mg/dL 629  528  413   BUN 8 - 23 mg/dL 12  13  13    Creatinine 0.61 - 1.24 mg/dL 2.44  0.10  2.72   Sodium 135 - 145 mmol/L 138  141  139   Potassium 3.5 - 5.1 mmol/L 4.6  4.3  5.7   Chloride 98 - 111 mmol/L 106  103  104   CO2 22 - 32 mmol/L 26   24   Calcium 8.9 - 10.3 mg/dL 9.2   9.4     External Labs: Collected: December 11, 2022 at [facility], available in Care Everywhere. Total cholesterol 124, triglycerides 163, HDL 41, LDL 55 High sensitive CRP 1.3 (within normal limits BUN 14, creatinine 0.94. eGFR 89. Sodium 142, potassium 4.8, chloride 104, bicarb 23. AST and alkaline phosphatase within normal limits. ALT 53 (above normal limits) Hemoglobin 15.5 g/dL. TSH 1.39.   Physical Exam:    Today's Vitals   01/01/23 0909  BP: 128/74  Pulse: 62  Resp: 16  SpO2: 94%  Weight: 248 lb 6.4 oz (112.7 kg)  Height: 5\' 10"  (1.778 m)   Body mass index is 35.64 kg/m. Wt Readings from Last 3 Encounters:  01/01/23 248 lb 6.4 oz (112.7 kg)  10/17/22 245 lb (111.1 kg)  10/10/22 245 lb 8 oz (111.4 kg)    Physical Exam  Constitutional: No distress.  hemodynamically stable  Neck: No JVD present.  Cardiovascular: Normal rate, regular rhythm, S1 normal, S2 normal, intact distal pulses and normal pulses. Exam reveals no gallop, no S3 and no S4.  No murmur heard. Pulmonary/Chest: Effort normal and breath sounds normal. No stridor. He has no wheezes. He has no rales.  Abdominal: Soft. Bowel sounds are normal. He exhibits no distension. There is no abdominal tenderness.  Musculoskeletal:        General: No edema.     Cervical back: Neck supple.  Neurological: He is alert and oriented to person, place, and time. He has intact cranial nerves (2-12).  Skin: Skin is warm and moist.     Impression & Recommendation(s):  Impression:   ICD-10-CM   1. Coronary artery disease involving native coronary artery of native  heart without angina pectoris  I25.10 EKG 12-Lead    2. History of coronary angioplasty with insertion of stent  Z95.5     3. Hx of ST elevation myocardial infarction  I25.2     4. Type 2 diabetes mellitus with hyperglycemia, with long-term current use of insulin (HCC)  E11.65    Z79.4     5. Type 2 diabetes mellitus with hyperlipidemia (HCC)  E11.69    E78.5     6. Essential hypertension  I10        Recommendation(s):  Coronary artery disease involving native coronary artery of native  heart without angina pectoris History of coronary angioplasty with insertion of stent Hx of ST elevation myocardial infarction EKG: Nonischemic. Echocardiogram: Preserved LVEF, normal diastolic function, defer to report for additional details. Angiography 05/2022: Patent stent, no new obstructive disease. Denies anginal chest pain or heart failure symptoms. LDL is currently at goal. Triglycerides-currently not at goal. We discussed initiation of Ozempic again.  He wants to discuss it further with his endocrinologist in the coming weeks.  If he gets an approval to start Ozempic I have also asked him to discuss insulin management as the dose will need to be reduced.  He will call us back if any questions or concerns arise. Overall functional capacity remains stable-enjoys walking 1 to 2 miles per day at work and tries to swim at [employer].  Type 2 diabetes mellitus with hyperglycemia, with long-term current use of insulin (HCC) Reemphasized importance of glycemic control. Currently on ARB, statin therapy, Invokana.  Type 2 diabetes mellitus with hyperlipidemia (HCC) Outside lipids independently reviewed from Care Everywhere. LDL is currently at goal. Triglycerides currently not at goal. Re emphasize importance of reducing foods that are high in triglycerides. Currently taking rosuvastatin 10 mg p.o. nightly I have asked him to increase it to 20 mg p.o. nightly for now.    Essential  hypertension Office blood pressures are at goal. Continue current blood pressure medications.   Orders Placed:  Orders Placed This Encounter  Procedures   EKG 12-Lead    As part of medical decision making results of the echo, EKG, prior heart catheterization report, outside labs were reviewed independently at today's visit.   Final Medication List:   No orders of the defined types were placed in this encounter.   Medications Discontinued During This Encounter  Medication Reason   carvedilol (COREG) 6.25 MG tablet      Current Outpatient Medications:    albuterol (VENTOLIN HFA) 108 (90 Base) MCG/ACT inhaler, Inhale 1 puff into the lungs daily as needed., Disp: , Rfl:    aspirin 81 MG chewable tablet, Chew 1 tablet (81 mg total) by mouth daily., Disp: , Rfl:    carvedilol (COREG) 12.5 MG tablet, Take 12.5 mg by mouth 2 (two) times daily., Disp: , Rfl:    Continuous Blood Gluc Sensor (FREESTYLE LIBRE 3 SENSOR) MISC, by Does not apply route., Disp: , Rfl:    ergocalciferol (VITAMIN D2) 1.25 MG (50000 UT) capsule, Take 50,000 Units by mouth once a week. Sunday, Disp: , Rfl:    fluticasone (CUTIVATE) 0.05 % cream, Apply 1 Application topically daily as needed (irritation)., Disp: , Rfl:    fluticasone (FLONASE) 50 MCG/ACT nasal spray, Place 2 sprays into both nostrils daily., Disp: , Rfl:    insulin aspart (NOVOLOG FLEXPEN) 100 UNIT/ML FlexPen, Inject 20 Units into the skin in the morning, at noon, in the evening, and at bedtime., Disp: , Rfl:    insulin glargine, 2 Unit Dial, (TOUJEO MAX SOLOSTAR) 300 UNIT/ML Solostar Pen, Inject 60 Units into the skin at bedtime., Disp: , Rfl:    INVOKAMET XR [phone] MG TB24, Take 1 tablet by mouth 2 (two) times daily with a meal., Disp: , Rfl:    ketoconazole (NIZORAL) 2 % cream, Apply 1 Application topically daily as needed for irritation., Disp: , Rfl:    losartan (COZAAR) 25 MG tablet, TAKE 1 TABLET EVERY EVENING (Patient taking differently: Take 25  mg by mouth at bedtime.), Disp: 90 tablet, Rfl: 3   nitroGLYCERIN (NITROSTAT) 0.4 MG SL tablet, Place  1 tablet (0.4 mg total) under the tongue every 5 (five) minutes as needed for chest pain., Disp: 25 tablet, Rfl: 1   nystatin ointment (MYCOSTATIN), Apply 1 application  topically 2 (two) times daily as needed (balanitis)., Disp: , Rfl:    pantoprazole (PROTONIX) 40 MG tablet, Take 40 mg by mouth daily., Disp: , Rfl:    rosuvastatin (CRESTOR) 20 MG tablet, Take 20 mg by mouth at bedtime., Disp: , Rfl:    SUPER B COMPLEX/C PO, Take 1 tablet by mouth daily., Disp: , Rfl:    traMADol (ULTRAM) 50 MG tablet, Take 1 tablet (50 mg total) by mouth every 6 (six) hours as needed., Disp: 20 tablet, Rfl: 0  Consent:      N/A  Disposition:   Return in about 1 year (around 01/01/2024) for Follow up, CAD. or sooner if needed.  His questions and concerns were addressed to his satisfaction. He voices understanding of the recommendations provided during this encounter.    Signed, Tessa Lerner, DO, [facility]  [facility]  [address] [place], [place] [address] [phone] 01/01/2023 12:08 PM

## 2024-03-06 ENCOUNTER — Ambulatory Visit: Attending: Cardiology | Admitting: Cardiology

## 2024-03-06 ENCOUNTER — Encounter: Payer: Self-pay | Admitting: Cardiology

## 2024-03-06 VITALS — BP 108/72 | HR 80 | Ht 70.0 in | Wt 242.0 lb

## 2024-03-06 DIAGNOSIS — E785 Hyperlipidemia, unspecified: Secondary | ICD-10-CM | POA: Diagnosis present

## 2024-03-06 DIAGNOSIS — I10 Essential (primary) hypertension: Secondary | ICD-10-CM

## 2024-03-06 DIAGNOSIS — Z955 Presence of coronary angioplasty implant and graft: Secondary | ICD-10-CM | POA: Diagnosis not present

## 2024-03-06 DIAGNOSIS — E1169 Type 2 diabetes mellitus with other specified complication: Secondary | ICD-10-CM

## 2024-03-06 DIAGNOSIS — I2510 Atherosclerotic heart disease of native coronary artery without angina pectoris: Secondary | ICD-10-CM

## 2024-03-06 DIAGNOSIS — Z794 Long term (current) use of insulin: Secondary | ICD-10-CM

## 2024-03-06 DIAGNOSIS — I252 Old myocardial infarction: Secondary | ICD-10-CM | POA: Diagnosis not present

## 2024-03-06 DIAGNOSIS — E1165 Type 2 diabetes mellitus with hyperglycemia: Secondary | ICD-10-CM | POA: Diagnosis not present

## 2024-03-06 NOTE — Progress Notes (Signed)
 Cardiology Office Note:  .   Date:  03/06/2024  ID:  Cory Sosa, DOB 08-23-1955, MRN [id number] PCP:  Dorcus Lamar POUR, MD  Former Cardiology Providers: NA [facility] Providers Cardiologist:  Madonna Large, DO , [facility] (established care 03/06/2024) Electrophysiologist:  None  Click to update primary MD,subspecialty MD or APP then REFRESH:1}    Chief Complaint  Patient presents with   Follow-up    1 year.   Chest Pain   History of Present Illness: .   Cory Sosa is a 68 y.o. [place] male whose past medical history and cardiovascular risk factors includes: hypertension, hyperlipidemia, diabetes mellitus, HX of STEMI s/p PCI to proximal to mid LAD, obesity.   History of anterior wall STEMI in November 2021 requiring PCI to the proximal/mid LAD.  In 2024 patient underwent left heart catheterization due to concerns for possible unstable angina and was noted to have no significant disease and prior stents were open.    Over the last 1 year patient is doing well from a cardiovascular standpoint. No heart failure symptoms. No hospitalizations or urgent care visits for cardiovascular symptoms He continues to have precordial discomfort which is chronic and stable. Occurs 1-2 times per week, intensity 1 out of 10, describes it as a person pressing on his sternum with 1 finger, nonradiating, not brought on by effort related activities, does not resolve with rest.  Interestingly more noticeable while he was driving, usually improves with reclining his seat and dropping the windows.  He denies any diaphoresis  Patient states that he is walking 2 miles every other day at work and able to go up several flights of stairs without any exertional chest pain.  Recent labs performed at [facility] reviewed.  A1c slowly improving  Tried Ozempic but had GI symptoms follows with endocrinology.   Review of Systems: .   Review of Systems  Cardiovascular:  Positive for chest pain (Noncardiac). Negative  for claudication, dyspnea on exertion, irregular heartbeat, leg swelling, near-syncope, orthopnea, palpitations, paroxysmal nocturnal dyspnea and syncope.  Respiratory:  Negative for shortness of breath.   Hematologic/Lymphatic: Negative for bleeding problem.  Musculoskeletal:  Negative for muscle cramps and myalgias.  Neurological:  Negative for dizziness and light-headedness.    Studies Reviewed:   EKG: EKG Interpretation Date/Time:  Friday March 06 2024 11:53:41 EST Ventricular Rate:  80 PR Interval:  174 QRS Duration:  90 QT Interval:  392 QTC Calculation: 452 R Axis:   -2  Text Interpretation: Normal sinus rhythm Consider Anteroseptal infarct , age undetermined When compared with ECG of 01-Jan-2023 09:13, Septal infarct is now Present Confirmed by Large Madonna [phone]) on 03/06/2024 12:08:31 PM  Echocardiogram: 06/19/2022:  Normal LV systolic function with visual EF 55-60%. Left ventricle cavity is normal in size. Normal left ventricular wall thickness. Normal global wall motion. Normal diastolic filling pattern, normal LAP. Calculated EF 54%. Trileaflet aortic valve with no regurgitation. Mild aortic valve leaflet calcification. Structurally normal tricuspid valve with trace regurgitation. No evidence of pulmonary hypertension. No significant change compared to 12/2020.   Heart Catheterization: 06/15/2022 LM: Normal LAD: Patent stent. No significant CAD. LCx: No significant disease RCA: Prox 20% disease  Normal LVEF Mildly elevated LVEDP  Carotid duplex: Carotid artery duplex 12/01/2020:  No hemodynamically significant arterial disease in the internal carotid artery bilaterally.  Very mild soft plaque noted in the bilateral CCA.  Antegrade right vertebral artery flow. Antegrade left vertebral artery flow.   RADIOLOGY: N/A  Risk Assessment/Calculations:   N/A  Labs:       Latest Ref Rng & Units 10/10/2022   10:42 AM 06/15/2022    2:01 PM 06/15/2022   12:01  PM  CBC  WBC 4.0 - 10.5 K/uL 6.9   8.2   Hemoglobin 13.0 - 17.0 g/dL 84.9  84.9  84.4   Hematocrit 39.0 - 52.0 % 45.8  44.0  47.3   Platelets 150 - 400 K/uL 284   299        Latest Ref Rng & Units 10/10/2022   10:42 AM 06/15/2022    2:01 PM 06/15/2022   12:01 PM  BMP  Glucose 70 - 99 mg/dL 828  876  864   BUN 8 - 23 mg/dL 12  13  13    Creatinine 0.61 - 1.24 mg/dL 8.97  8.99  8.97   Sodium 135 - 145 mmol/L 138  141  139   Potassium 3.5 - 5.1 mmol/L 4.6  4.3  5.7   Chloride 98 - 111 mmol/L 106  103  104   CO2 22 - 32 mmol/L 26   24   Calcium  8.9 - 10.3 mg/dL 9.2   9.4       Latest Ref Rng & Units 10/10/2022   10:42 AM 06/15/2022    2:01 PM 06/15/2022   12:01 PM  CMP  Glucose 70 - 99 mg/dL 828  876  864   BUN 8 - 23 mg/dL 12  13  13    Creatinine 0.61 - 1.24 mg/dL 8.97  8.99  8.97   Sodium 135 - 145 mmol/L 138  141  139   Potassium 3.5 - 5.1 mmol/L 4.6  4.3  5.7   Chloride 98 - 111 mmol/L 106  103  104   CO2 22 - 32 mmol/L 26   24   Calcium  8.9 - 10.3 mg/dL 9.2   9.4     External Labs: Collected: December 11, 2022 at [facility], available in Care Everywhere. Total cholesterol 124, triglycerides 163, HDL 41, LDL 55 High sensitive CRP 1.3 (within normal limits BUN 14, creatinine 0.94. eGFR 89. Sodium 142, potassium 4.8, chloride 104, bicarb 23. AST and alkaline phosphatase within normal limits. ALT 53 (above normal limits) Hemoglobin 15.5 g/dL. TSH 1.39.  External Labs: Collected: 12/11/2023 [facility] care everywhere A1c 8.8 Total cholesterol 112, triglycerides 132, HDL 39, LDL calculated 50 BUN 16, creatinine 0.93. eGFR 89. Potassium 4.4. AST and ALT abnormal and slightly above goal, alkaline phosphatase within normal limits  Physical Exam:    Today's Vitals   03/06/24 1151  BP: 108/72  Pulse: 80  Weight: 242 lb (109.8 kg)  Height: 5' 10 (1.778 m)   Body mass index is 34.72 kg/m. Wt Readings from Last 3 Encounters:  03/06/24 242 lb (109.8 kg)   01/01/23 248 lb 6.4 oz (112.7 kg)  10/17/22 245 lb (111.1 kg)    Physical Exam  Constitutional: No distress.  hemodynamically stable  Neck: No JVD present.  Cardiovascular: Normal rate, regular rhythm, S1 normal, S2 normal, intact distal pulses and normal pulses. Exam reveals no gallop, no S3 and no S4.  No murmur heard. Pulmonary/Chest: Effort normal and breath sounds normal. No stridor. He has no wheezes. He has no rales.  Abdominal: Soft. Bowel sounds are normal. He exhibits no distension. There is no abdominal tenderness.  Musculoskeletal:        General: No edema.     Cervical back: Neck supple.  Neurological: He is alert and oriented to person,  place, and time. He has intact cranial nerves (2-12).  Skin: Skin is warm and moist.     Impression & Recommendation(s):  Impression:   ICD-10-CM   1. Coronary artery disease involving native coronary artery of native heart without angina pectoris  I25.10 EKG 12-Lead    2. History of coronary angioplasty with insertion of stent  Z95.5     3. Hx of ST elevation myocardial infarction  I25.2     4. Type 2 diabetes mellitus with hyperglycemia, with long-term current use of insulin  (HCC)  E11.65    Z79.4     5. Type 2 diabetes mellitus with hyperlipidemia (HCC)  E11.69    E78.5     6. Essential hypertension  I10        Recommendation(s):  Coronary artery disease involving native coronary artery of native heart without angina pectoris History of coronary angioplasty with insertion of stent Hx of ST elevation myocardial infarction EKG: Nonischemic. Echocardiogram: Preserved LVEF, normal diastolic function, defer to report for additional details. Angiography 05/2022: Patent stent, no new obstructive disease. His precordial discomfort predominately noncardiac. He has no symptoms while walking up a few flights of stairs or walking 2 miles every other day.   LDL and triglycerides are at goal.  Outside labs performed at [facility]  from 12/11/2023 independently reviewed in Care Everywhere Tried Ozempic since last visit but discontinued secondary to GI symptoms. He has had very recent cardiovascular testing. Shared decision was to hold off on additional testing at this time as his symptoms are not anginal equivalents, no exertional components.  Patient agreeable with the plan of care.  Type 2 diabetes mellitus with hyperglycemia, with long-term current use of insulin  (HCC) Reemphasized importance of glycemic control. Hemoglobin A1c improving, patient states most recent A1c 7.8, in September 2025 was 8.8. Currently on ARB, statin therapy, Invokana.  Type 2 diabetes mellitus with hyperlipidemia (HCC) Outside lipids independently reviewed from Care Everywhere. LDL is currently at goal. Triglycerides are now at goal when compared to last year, congratulated on his efforts  Continue rosuvastatin  20 mg p.o. nightly AST/ALT are slightly elevated - recommend that he follow up with PCP for further evaluation if still pending. Continue to monitor LFTS  Essential hypertension Well-controlled. Continue carvedilol  12.5 mg p.o. twice daily. Continue losartan  25 mg p.o. every afternoon   Orders Placed:  Orders Placed This Encounter  Procedures   EKG 12-Lead   Final Medication List:   No orders of the defined types were placed in this encounter.   There are no discontinued medications.    Current Outpatient Medications:    albuterol (VENTOLIN HFA) 108 (90 Base) MCG/ACT inhaler, Inhale 1 puff into the lungs daily as needed., Disp: , Rfl:    aspirin  81 MG chewable tablet, Chew 1 tablet (81 mg total) by mouth daily., Disp: , Rfl:    carvedilol  (COREG ) 12.5 MG tablet, Take 12.5 mg by mouth 2 (two) times daily., Disp: , Rfl:    Continuous Blood Gluc Sensor (FREESTYLE LIBRE 3 SENSOR) MISC, by Does not apply route., Disp: , Rfl:    ergocalciferol (VITAMIN D2) 1.25 MG (50000 UT) capsule, Take 50,000 Units by mouth once a week.  Sunday, Disp: , Rfl:    fluticasone (CUTIVATE) 0.05 % cream, Apply 1 Application topically daily as needed (irritation)., Disp: , Rfl:    fluticasone (FLONASE) 50 MCG/ACT nasal spray, Place 2 sprays into both nostrils daily., Disp: , Rfl:    insulin  aspart (NOVOLOG  FLEXPEN) 100 UNIT/ML FlexPen,  Inject 20 Units into the skin in the morning, at noon, in the evening, and at bedtime., Disp: , Rfl:    insulin  glargine, 2 Unit Dial, (TOUJEO MAX SOLOSTAR) 300 UNIT/ML Solostar Pen, Inject 60 Units into the skin at bedtime., Disp: , Rfl:    INVOKAMET XR [phone] MG TB24, Take 1 tablet by mouth 2 (two) times daily with a meal., Disp: , Rfl:    ketoconazole (NIZORAL) 2 % cream, Apply 1 Application topically daily as needed for irritation., Disp: , Rfl:    losartan  (COZAAR ) 25 MG tablet, TAKE 1 TABLET EVERY EVENING (Patient taking differently: Take 25 mg by mouth at bedtime.), Disp: 90 tablet, Rfl: 3   nitroGLYCERIN  (NITROSTAT ) 0.4 MG SL tablet, Place 1 tablet (0.4 mg total) under the tongue every 5 (five) minutes as needed for chest pain., Disp: 25 tablet, Rfl: 1   nystatin ointment (MYCOSTATIN), Apply 1 application  topically 2 (two) times daily as needed (balanitis)., Disp: , Rfl:    pantoprazole  (PROTONIX ) 40 MG tablet, Take 40 mg by mouth daily., Disp: , Rfl:    rosuvastatin  (CRESTOR ) 20 MG tablet, Take 20 mg by mouth at bedtime., Disp: , Rfl:    SUPER B COMPLEX/C PO, Take 1 tablet by mouth daily., Disp: , Rfl:   Consent:      N/A  Disposition:   1 year follow-up sooner if needed  His questions and concerns were addressed to his satisfaction. He voices understanding of the recommendations provided during this encounter.   Medical Decision Making:  Discussed management of at least two chronic comorbid conditions. I have independently interpreted results of the EKG dated 03/06/2024, labs dated 11/2023 Care Everywhere, and reviewed the results under studies reviewed as part of medical decision  making.Patient education provided regarding his cardiovascular care. Prescription drug management including but not limited to: addition/titration/discontinuation of medical therapy, medication reconciliation, discussed potential side effects. I have independently formulated and discussed the assessment and plan as outline above with the patient. This note will be shared with the patient's primary care provider to coordinate care.    Signed, Madonna Michele HAS, [facility]  A Division of [facility] [address] [place], [place] [address]  03/06/2024 12:53 PM

## 2024-03-06 NOTE — Patient Instructions (Signed)
 Medication Instructions:  Your physician recommends that you continue on your current medications as directed. Please refer to the Current Medication list given to you today.  *If you need a refill on your cardiac medications before your next appointment, please call your pharmacy*  Lab Work: None ordered If you have labs (blood work) drawn today and your tests are completely normal, you will receive your results only by: MyChart Message (if you have MyChart) OR A paper copy in the mail If you have any lab test that is abnormal or we need to change your treatment, we will call you to review the results.  Testing/Procedures: None ordered  Follow-Up: At [facility], you and your health needs are our priority.  As part of our continuing mission to provide you with exceptional heart care, our providers are all part of one team.  This team includes your primary Cardiologist (physician) and Advanced Practice Providers or APPs (Physician Assistants and Nurse Practitioners) who all work together to provide you with the care you need, when you need it.  Your next appointment:   1 year(s)  Provider:   Madonna Large, DO    We recommend signing up for the patient portal called MyChart.  Sign up information is provided on this After Visit Summary.  MyChart is used to connect with patients for Virtual Visits (Telemedicine).  Patients are able to view lab/test results, encounter notes, upcoming appointments, etc.  Non-urgent messages can be sent to your provider as well.   To learn more about what you can do with MyChart, go to [email or web address].
